# Patient Record
Sex: Male | Born: 1965
Health system: Southern US, Community
[De-identification: ages and names within clinical notes are randomized; demographics above are authoritative.]

## PROBLEM LIST (undated history)

## (undated) DIAGNOSIS — I519 Heart disease, unspecified: Secondary | ICD-10-CM

## (undated) DIAGNOSIS — G500 Trigeminal neuralgia: Secondary | ICD-10-CM

## (undated) DIAGNOSIS — G43909 Migraine, unspecified, not intractable, without status migrainosus: Secondary | ICD-10-CM

## (undated) DIAGNOSIS — I10 Essential (primary) hypertension: Secondary | ICD-10-CM

## (undated) HISTORY — DX: Trigeminal neuralgia: G50.0

## (undated) HISTORY — DX: Migraine, unspecified, not intractable, without status migrainosus: G43.909

## (undated) HISTORY — DX: Essential (primary) hypertension: I10

## (undated) HISTORY — DX: Heart disease, unspecified: I51.9

---

## 2013-09-12 DIAGNOSIS — I2510 Atherosclerotic heart disease of native coronary artery without angina pectoris: Secondary | ICD-10-CM

## 2013-09-12 HISTORY — DX: Atherosclerotic heart disease of native coronary artery without angina pectoris: I25.10

## 2019-08-31 ENCOUNTER — Encounter: Payer: Self-pay | Admitting: Cardiovascular Disease

## 2019-08-31 ENCOUNTER — Other Ambulatory Visit: Payer: Self-pay

## 2019-08-31 ENCOUNTER — Ambulatory Visit (INDEPENDENT_AMBULATORY_CARE_PROVIDER_SITE_OTHER): Payer: BC Managed Care – PPO | Admitting: Cardiovascular Disease

## 2019-08-31 VITALS — BP 144/108 | HR 96 | Ht 72.0 in | Wt 217.2 lb

## 2019-08-31 DIAGNOSIS — E7800 Pure hypercholesterolemia, unspecified: Secondary | ICD-10-CM | POA: Diagnosis not present

## 2019-08-31 DIAGNOSIS — I10 Essential (primary) hypertension: Secondary | ICD-10-CM

## 2019-08-31 DIAGNOSIS — I25110 Atherosclerotic heart disease of native coronary artery with unstable angina pectoris: Secondary | ICD-10-CM | POA: Diagnosis not present

## 2019-08-31 MED ORDER — NEBIVOLOL HCL 10 MG PO TABS: 10.0000 mg | ORAL_TABLET | Freq: Every day | ORAL | 5 refills | Status: DC

## 2019-08-31 MED ORDER — ATORVASTATIN CALCIUM 80 MG PO TABS: 80.0000 mg | ORAL_TABLET | Freq: Every day | ORAL | 5 refills | Status: DC

## 2019-08-31 NOTE — Progress Notes (Signed)
Cardiology Office Note:    Date:  08/31/2019   ID:  Kevin Browning, DOB 06/20/1966, MRN [id number]  PCP:  No primary care provider on file.  Cardiologist:  No primary care provider on file. NEW Electrophysiologist:  None   Referring MD: No ref. provider found   Chief Complaint  Patient presents with  . Coronary Artery Disease  . Chest Pain    History of Present Illness:    Kevin Browning is a 54 y.o. male with a hx of coronary artery disease presenting with exertional angina in March 2015, 95% LAD stenosis, treated with placement of a drug-eluting Promus Premier 3.5x38 stent in the proximal LAD artery [facility]).  He now presents with exertional chest discomfort occurring with varying degrees of activity.  Although he reports that he has not had chest discomfort at rest, his wife states that she has noticed him clutching his chest simply sitting in a chair.  For the most part he states that his chest discomfort occurs during work and resolves within 3 to 4 minutes of stopping to rest.  It often occurs after climbing 1 flight of stairs or even lighter activity, although the threshold is variable.  The symptoms began approximately a month ago and there seems to be a pattern of worsening.  He describes the discomfort as radiating from the lower left side of his chest to the right side associated with discomfort in his left jaw and left arm.  He motions to the center of his chest with a clawlike movement and describes the sensation as an elephant standing on his chest.  He reports that his work-up for these symptoms in 2015 was lengthy because his electrocardiogram was always normal.  He eventually underwent cardiac catheterization that showed the high-grade stenosis in the proximal LAD artery.  I was able to review the images of his angiogram on a CD.  It appears that he had a successful revascularization of the LAD artery, but it should be also believe there is an 80% stenosis in a rather large  first diagonal artery that was left for medical therapy.  There were no significant lesions in the left circumflex or dominant right coronary artery.  Left ventricular regional wall motion and overall systolic function was normal with an EF of 60% by both angiography and echo.  No significant wall motion abnormalities or valvular abnormalities were seen on his echocardiogram.  After his stent he was prescribed a number of medications, which included metoprolol, rosuvastatin and clopidogrel.  He essentially stopped all of them after about 2 months since they made him feel bad.  He does not remember the exact side effects.  He did not return for follow-up.  He continues to take aspirin 81 mg daily but is not on any other cardiac medications.  He has trigeminal neuralgia (sharp shooting pain on the right side of his jaw, currently well controlled on gabapentin and Trileptal.  He otherwise considers himself fit and healthy.  He is preparing to move into a new home [place] in about 3 weeks and is worried that his cardiac work-up will interfere with that.  His wife Kevin Browning is a Geneticist, molecular.  They are originally from [place].  Their oldest son Kevin Browning is currently in medical school.  His younger brother had multivessel coronary bypass surgery about the same time that [name] received this stent.  Past Medical History:  Diagnosis Date  . CAD (coronary artery disease) 09/2013  . HTN (hypertension)  History reviewed. No pertinent surgical history.  Current Medications: Current Meds  Medication Sig  . aspirin EC 81 MG tablet Take 81 mg by mouth daily.  Marland Kitchen gabapentin (NEURONTIN) 300 MG capsule Take 300 mg by mouth 2 (two) times daily.  . Oxcarbazepine (TRILEPTAL) 300 MG tablet Take 300 mg by mouth 2 (two) times daily.     Allergies:   Patient has no known allergies.   Social History   Socioeconomic History  . Marital status: Married    Spouse name: Not on file  . Number of  children: Not on file  . Years of education: Not on file  . Highest education level: Not on file  Occupational History  . Not on file  Tobacco Use  . Smoking status: Never Smoker  . Smokeless tobacco: Never Used  Substance and Sexual Activity  . Alcohol use: Never  . Drug use: Never  . Sexual activity: Not on file  Other Topics Concern  . Not on file  Social History Narrative  . Not on file   Social Determinants of Health   Financial Resource Strain:   . Difficulty of Paying Living Expenses: Not on file  Food Insecurity:   . Worried About Programme researcher, broadcasting/film/video in the Last Year: Not on file  . Ran Out of Food in the Last Year: Not on file  Transportation Needs:   . Lack of Transportation (Medical): Not on file  . Lack of Transportation (Non-Medical): Not on file  Physical Activity:   . Days of Exercise per Week: Not on file  . Minutes of Exercise per Session: Not on file  Stress:   . Feeling of Stress : Not on file  Social Connections:   . Frequency of Communication with Friends and Family: Not on file  . Frequency of Social Gatherings with Friends and Family: Not on file  . Attends Religious Services: Not on file  . Active Member of Clubs or Organizations: Not on file  . Attends Banker Meetings: Not on file  . Marital Status: Not on file     Family History: The patient's family history includes CAD in his brother; Cancer - Colon in his father; Diabetes in his mother; Diabetes Mellitus II in his father; Heart failure in his maternal grandmother; Hypertension in his father and mother.  ROS:   Please see the history of present illness.     All other systems reviewed and are negative.  EKGs/Labs/Other Studies Reviewed:    The following studies were reviewed today: Angiograms of diagnostic and interventional cardiac catheterization from [place], March 2015, report of echocardiogram from 2015.  EKG:  EKG is ordered today.  The ekg ordered today demonstrates  normal sinus rhythm, normal tracing  Recent Labs: No results found for requested labs within last 8760 hours.  Recent Lipid Panel No results found for: CHOL, TRIG, HDL, CHOLHDL, VLDL, LDLCALC, LDLDIRECT  Physical Exam:    VS:  BP (!) 144/108   Pulse 96   Ht 6' (1.829 m)   Wt 217 lb 3.2 oz (98.5 kg)   BMI 29.46 kg/m    Pressure rechecked 15 minutes later was 152/92 mmHg  Wt Readings from Last 3 Encounters:  08/31/19 217 lb 3.2 oz (98.5 kg)     GEN: Overweight, well nourished, well developed in no acute distress HEENT: Normal NECK: No JVD; No carotid bruits LYMPHATICS: No lymphadenopathy CARDIAC: RRR, no murmurs, rubs, gallops RESPIRATORY:  Clear to auscultation without rales, wheezing or rhonchi  ABDOMEN: Soft, non-tender, non-distended MUSCULOSKELETAL:  No edema; No deformity  SKIN: Warm and dry NEUROLOGIC:  Alert and oriented x 3 PSYCHIATRIC:  Normal affect   ASSESSMENT:    1. Coronary artery disease involving native coronary artery of native heart with unstable angina pectoris (HCC)    PLAN:    In order of problems listed above:  1. Unstable angina: Sleight is trying to minimize his symptoms, but it sounds quite convincingly that he has unstable angina pectoris.  I impressed upon him that he needs to seek emergency medical attention if his symptoms ever last for 25-30 minutes or longer.  He repeatedly tells me that the symptoms always resolve if he stops exercise.  I offered a prescription for sublingual nitroglycerin, but he declined.  He has a rather fast resting heart rate as well as elevated blood pressure and I think we will derive a lot of benefit from beta-blocker.  It appears that he tolerated metoprolol poorly in the past and this may be the reason why he stopped all of his cardiac medications.  I gave him a prescription for Bystolic 10 mg once daily and a co-pay card.  He should continue his aspirin and he will need to start a statin, once we get a fasting lipid  profile.  The EKG is reassuring, but he tells me that his EKG was also normal in the past.  We will schedule him for a Lexiscan Myoview.  If he has a reversible ischemia in the territory of the diagonal artery we might be able to simply treat this with medications.  If he has a larger area of ischemia he should undergo coronary angiography and revascularization as indicated. 2. HTN: Start Bystolic 10 mg once daily. 3. HLP: Atorvastatin 80 mg once daily, after we obtain a fasting lipid profile.   Medication Adjustments/Labs and Tests Ordered: Current medicines are reviewed at length with the patient today.  Concerns regarding medicines are outlined above.  Orders Placed This Encounter  Procedures  . Lipid panel  . MYOCARDIAL PERFUSION IMAGING  . EKG 12-Lead   Meds ordered this encounter  Medications  . atorvastatin (LIPITOR) 80 MG tablet    Sig: Take 1 tablet (80 mg total) by mouth daily.    Dispense:  30 tablet    Refill:  5  . nebivolol (BYSTOLIC) 10 MG tablet    Sig: Take 1 tablet (10 mg total) by mouth daily.    Dispense:  30 tablet    Refill:  5    Patient Instructions  Medication Instructions:  START Bystolic 10 mg once daily START Atorvastatin 80 mg once daily after you have had the fasting Lipid lab  *If you need a refill on your cardiac medications before your next appointment, please call your pharmacy*  Lab Work: Your provider would like for you to return in within a few weeks to have the following labs drawn: fasting Lipid. You do not need an appointment for the lab. Once in our office lobby there is a podium where you can sign in and ring the doorbell to alert [place] that you are here. The lab is open from 8:00 am to 4:30 pm; closed for lunch from 12:45pm-1:45pm.  If you have labs (blood work) drawn today and your tests are completely normal, you will receive your results only by: Marland Kitchen MyChart Message (if you have MyChart) OR . A paper copy in the mail If you have any lab  test that is abnormal or we need  to change your treatment, we will call you to review the results.  Testing/Procedures: Your physician has requested that you have a lexiscan myoview. For further information please visit [email or web address] Please follow instruction sheet, as given. This will take place at [address] [facility], [address]  How to prepare for your Myocardial Perfusion Test:  Do not eat or drink 3 hours prior to your test, except you may have water.  Do not consume products containing caffeine (regular or decaffeinated) 12 hours prior to your test. (ex: coffee, chocolate, sodas, tea).  Do bring a list of your current medications with you.  If not listed below, you may take your medications as normal.  Do wear comfortable clothes (no dresses or overalls) and walking shoes, tennis shoes preferred (No heels or open toe shoes are allowed).  Do NOT wear cologne, perfume, aftershave, or lotions (deodorant is allowed).  The test will take approximately 3 to 4 hours to complete  If these instructions are not followed, your test will have to be rescheduled.    Follow-Up: At [facility], you and your health needs are our priority.  As part of our continuing mission to provide you with exceptional heart care, we have created designated Provider Care Teams.  These Care Teams include your primary Cardiologist (physician) and Advanced Practice Providers (APPs -  Physician Assistants and Nurse Practitioners) who all work together to provide you with the care you need, when you need it.  Your next appointment:   Follow up with Dr. Royann Shivers for a virtual visit after the testing has been completed.    Signed, Thurmon Fair, MD  08/31/2019 5:47 PM    [facility]

## 2019-08-31 NOTE — Patient Instructions (Addendum)
Medication Instructions:  START Bystolic 10 mg once daily START Atorvastatin 80 mg once daily after you have had the fasting Lipid lab  *If you need a refill on your cardiac medications before your next appointment, please call your pharmacy*  Lab Work: Your provider would like for you to return in within a few weeks to have the following labs drawn: fasting Lipid. You do not need an appointment for the lab. Once in our office lobby there is a podium where you can sign in and ring the doorbell to alert [place] that you are here. The lab is open from 8:00 am to 4:30 pm; closed for lunch from 12:45pm-1:45pm.  If you have labs (blood work) drawn today and your tests are completely normal, you will receive your results only by: Marland Kitchen MyChart Message (if you have MyChart) OR . A paper copy in the mail If you have any lab test that is abnormal or we need to change your treatment, we will call you to review the results.  Testing/Procedures: Your physician has requested that you have a lexiscan myoview. For further information please visit [email or web address] Please follow instruction sheet, as given. This will take place at [address] [facility], [address]  How to prepare for your Myocardial Perfusion Test:  Do not eat or drink 3 hours prior to your test, except you may have water.  Do not consume products containing caffeine (regular or decaffeinated) 12 hours prior to your test. (ex: coffee, chocolate, sodas, tea).  Do bring a list of your current medications with you.  If not listed below, you may take your medications as normal.  Do wear comfortable clothes (no dresses or overalls) and walking shoes, tennis shoes preferred (No heels or open toe shoes are allowed).  Do NOT wear cologne, perfume, aftershave, or lotions (deodorant is allowed).  The test will take approximately 3 to 4 hours to complete  If these instructions are not followed, your test will have to be rescheduled.    Follow-Up: At  [facility], you and your health needs are our priority.  As part of our continuing mission to provide you with exceptional heart care, we have created designated Provider Care Teams.  These Care Teams include your primary Cardiologist (physician) and Advanced Practice Providers (APPs -  Physician Assistants and Nurse Practitioners) who all work together to provide you with the care you need, when you need it.  Your next appointment:   Follow up with Dr. Royann Shivers for a virtual visit after the testing has been completed.

## 2019-09-01 ENCOUNTER — Telehealth (HOSPITAL_COMMUNITY): Payer: Self-pay

## 2019-09-01 LAB — LIPID PANEL
Chol/HDL Ratio: 6.9 ratio — ABNORMAL HIGH (ref 0.0–5.0)
Cholesterol, Total: 159 mg/dL (ref 100–199)
HDL: 23 mg/dL — ABNORMAL LOW (ref >39)
LDL Chol Calc (NIH): 94 mg/dL (ref 0–99)
Triglycerides: 247 mg/dL — ABNORMAL HIGH (ref 0–149)
VLDL Cholesterol Cal: 42 mg/dL — ABNORMAL HIGH (ref 5–40)

## 2019-09-01 NOTE — Telephone Encounter (Signed)
Encounter complete. 

## 2019-09-02 ENCOUNTER — Telehealth: Payer: Self-pay | Admitting: *Deleted

## 2019-09-02 ENCOUNTER — Inpatient Hospital Stay (HOSPITAL_COMMUNITY): Admission: RE | Admit: 2019-09-02 | Payer: Self-pay | Source: Ambulatory Visit

## 2019-09-02 DIAGNOSIS — E785 Hyperlipidemia, unspecified: Secondary | ICD-10-CM

## 2019-09-02 NOTE — Telephone Encounter (Signed)
-----   Message from Thurmon Fair, MD sent at 09/01/2019  5:09 PM EST ----- The total cholesterol is not bad and the LDL is slightly above target (94, want it <70). The elevated triglycerides and VERY low HDL do not need meds and will get better with weight loss, avoiding excessive carbs (sweets and starches). Doing this is very important to avoid more problems in the future and to avoid developing diabetes. I very strongly recommend starting a statin to reduce the LDL. He will not need a potent or high dose statin. Since he may have had problems with Crestor in the past, I would recommend atorvastatin 10 mg daily (and recheck lipids in 3 months).

## 2019-09-02 NOTE — Telephone Encounter (Signed)
Left a message for the patient to call back.  

## 2019-09-06 MED ORDER — METOPROLOL SUCCINATE ER 25 MG PO TB24: 25.0000 mg | ORAL_TABLET | Freq: Every day | ORAL | 3 refills | Status: AC

## 2019-09-06 MED ORDER — ATORVASTATIN CALCIUM 10 MG PO TABS: 10.0000 mg | ORAL_TABLET | Freq: Every day | ORAL | 5 refills | Status: DC

## 2019-09-06 NOTE — Telephone Encounter (Signed)
Patient made aware of results and verbalized understanding.  The patient stated that he would like a replacement for Bystolic due to price. Per Dr. Royann Shivers, he may try Metoprolol Succinate 25 mg once daily.   Both medications have been sent in for the patient.

## 2019-09-06 NOTE — Telephone Encounter (Signed)
Left a message for the patient to call back.  

## 2019-09-07 ENCOUNTER — Telehealth: Payer: Self-pay | Admitting: Cardiovascular Disease

## 2019-09-08 ENCOUNTER — Telehealth (HOSPITAL_COMMUNITY): Payer: Self-pay

## 2019-09-08 NOTE — Telephone Encounter (Signed)
Encounter complete. 

## 2019-09-10 ENCOUNTER — Encounter: Payer: Self-pay | Admitting: *Deleted

## 2019-09-10 ENCOUNTER — Telehealth: Payer: Self-pay | Admitting: *Deleted

## 2019-09-10 ENCOUNTER — Ambulatory Visit (HOSPITAL_COMMUNITY)
Admission: RE | Admit: 2019-09-10 | Discharge: 2019-09-10 | Disposition: A | Discharge status: Home / Self Care | Payer: BC Managed Care – PPO | Source: Ambulatory Visit | Attending: Cardiovascular Disease | Admitting: Cardiovascular Disease

## 2019-09-10 ENCOUNTER — Other Ambulatory Visit: Payer: Self-pay

## 2019-09-10 ENCOUNTER — Other Ambulatory Visit (HOSPITAL_COMMUNITY)
Admission: RE | Admit: 2019-09-10 | Discharge: 2019-09-10 | Disposition: A | Discharge status: Home / Self Care | Payer: BC Managed Care – PPO | Source: Ambulatory Visit | Attending: Cardiovascular Disease | Admitting: Cardiovascular Disease

## 2019-09-10 DIAGNOSIS — Z20822 Contact with and (suspected) exposure to covid-19: Secondary | ICD-10-CM | POA: Diagnosis not present

## 2019-09-10 DIAGNOSIS — Z01810 Encounter for preprocedural cardiovascular examination: Secondary | ICD-10-CM | POA: Diagnosis not present

## 2019-09-10 DIAGNOSIS — Z8249 Family history of ischemic heart disease and other diseases of the circulatory system: Secondary | ICD-10-CM | POA: Diagnosis not present

## 2019-09-10 DIAGNOSIS — Z01812 Encounter for preprocedural laboratory examination: Secondary | ICD-10-CM | POA: Diagnosis not present

## 2019-09-10 DIAGNOSIS — Z01818 Encounter for other preprocedural examination: Secondary | ICD-10-CM | POA: Diagnosis not present

## 2019-09-10 DIAGNOSIS — I25110 Atherosclerotic heart disease of native coronary artery with unstable angina pectoris: Secondary | ICD-10-CM | POA: Insufficient documentation

## 2019-09-10 LAB — MYOCARDIAL PERFUSION IMAGING
LV dias vol: 132 mL (ref 62–150)
LV sys vol: 71 mL
Peak HR: 96 {beats}/min
Rest HR: 61 {beats}/min
SDS: 11
SRS: 2
SSS: 13
TID: 1.19

## 2019-09-10 MED ORDER — TECHNETIUM TC 99M TETROFOSMIN IV KIT
30.0000 | PACK | Freq: Once | INTRAVENOUS | Status: AC | PRN
  Administered 2019-09-10: 30 via INTRAVENOUS
  Filled 2019-09-10: qty 30

## 2019-09-10 MED ORDER — AMINOPHYLLINE 25 MG/ML IV SOLN
75.0000 mg | Freq: Once | INTRAVENOUS | Status: AC
  Administered 2019-09-10: 75 mg via INTRAVENOUS

## 2019-09-10 MED ORDER — REGADENOSON 0.4 MG/5ML IV SOLN
0.4000 mg | Freq: Once | INTRAVENOUS | Status: AC
  Administered 2019-09-10: 0.4 mg via INTRAVENOUS

## 2019-09-10 MED ORDER — TECHNETIUM TC 99M TETROFOSMIN IV KIT
9.7000 | PACK | Freq: Once | INTRAVENOUS | Status: AC | PRN
  Administered 2019-09-10: 9.7 via INTRAVENOUS
  Filled 2019-09-10: qty 10

## 2019-09-10 NOTE — Progress Notes (Addendum)
Valsartan reviewed the results of the myocardial perfusion study with Kevin Browning. It is a high risk study.  There is evidence of ischemia in the distribution of the mid-distal LAD artery (where he has previously had a stent) as well as ischemia in the basal-mid inferolateral wall, probably corresponding to a stenosis in the left circumflex coronary artery.  The left ventricular ejection fraction is mildly reduced.  There is no evidence of permanent scar. He continues to have exertional angina pectoris.  He reports that the symptoms occur with physical activity (he has been moving into a new home and carrying boxes), but that they promptly resolved within 5 minutes of rest.  He has been prescribed aspirin, statin and a beta-blocker. I believe he should undergo coronary angiography and if necessary and appropriate, percutaneous revascularization. The risks and benefits of diagnostic coronary angiography and possible angioplasty-stent(s) were reviewed in detail with him.  He understands and agrees to proceed.  We also reviewed the fact that he may need to indication for surgical coronary revascularization (bypass surgery) if he has multivessel involvement. Preliminary laboratory testing and Covid testing will be performed today and he is scheduled for the cardiac catheterization first case with Dr. Tom Kelly on Tuesday. 

## 2019-09-10 NOTE — H&P (View-Only) (Signed)
Valsartan reviewed the results of the myocardial perfusion study with Kevin Browning. It is a high risk study.  There is evidence of ischemia in the distribution of the mid-distal LAD artery (where he has previously had a stent) as well as ischemia in the basal-mid inferolateral wall, probably corresponding to a stenosis in the left circumflex coronary artery.  The left ventricular ejection fraction is mildly reduced.  There is no evidence of permanent scar. He continues to have exertional angina pectoris.  He reports that the symptoms occur with physical activity (he has been moving into a new home and carrying boxes), but that they promptly resolved within 5 minutes of rest.  He has been prescribed aspirin, statin and a beta-blocker. I believe he should undergo coronary angiography and if necessary and appropriate, percutaneous revascularization. The risks and benefits of diagnostic coronary angiography and possible angioplasty-stent(s) were reviewed in detail with him.  He understands and agrees to proceed.  We also reviewed the fact that he may need to indication for surgical coronary revascularization (bypass surgery) if he has multivessel involvement. Preliminary laboratory testing and Covid testing will be performed today and he is scheduled for the cardiac catheterization first case with Dr. Daphene Jaeger on Tuesday.

## 2019-09-10 NOTE — Telephone Encounter (Signed)
The patient came in for a stress test which was abnormal. Per Dr. Royann Shivers, the patient will need a left heart cath. This has been scheduled for 09/14/19 with Dr. Tresa Endo.  The patient has verbalized his understanding. Instructions have been given.  You are scheduled for a Cardiac Catheterization on Tuesday, March 2 with Dr. [name].  1. Please arrive at the [facility] (Main Entrance A) at [facility]: [address] [place], [place] [address] at 5:30 AM (This time is two hours before your procedure to ensure your preparation). Free valet parking service is available.   Special note: Every effort is made to have your procedure done on time. Please understand that emergencies sometimes delay scheduled procedures.  2. Diet: Do not eat solid foods after midnight.  The patient may have clear liquids until 5am upon the day of the procedure.  3. Labs: Your provider would like for you to have the following labs today: CBC and BMET  You will need to have the coronavirus test completed prior to your procedure. An appointment has been made at 11:50 on 09/10/19. This is a Drive Up Visit at the [facility] [address] Someone will direct you to the appropriate testing line. Please tell them that you are there for procedure testing. Stay in your car and someone will be with you shortly. Please make sure to have all other labs completed before this test because you will need to stay quarantined until your procedure.  On the morning of your procedure, take your Aspirin and any morning medicines NOT listed above.  You may use sips of water.  5. Plan for one night stay--bring personal belongings. 6. Bring a current list of your medications and current insurance cards. 7. You MUST have a responsible person to drive you home. 8. Someone MUST be with you the first 24 hours after you arrive home or your discharge will be delayed. 9. Please wear clothes that are easy to get on and off and  wear slip-on shoes.  Thank you for allowing [place] to care for you!   -- [facility]

## 2019-09-11 LAB — CBC
Hematocrit: 42.7 % (ref 37.5–51.0)
Hemoglobin: 14.5 g/dL (ref 13.0–17.7)
MCH: 29 pg (ref 26.6–33.0)
MCHC: 34 g/dL (ref 31.5–35.7)
MCV: 85 fL (ref 79–97)
Platelets: 186 10*3/uL (ref 150–450)
RBC: 5 x10E6/uL (ref 4.14–5.80)
RDW: 13.6 % (ref 11.6–15.4)
WBC: 4.2 10*3/uL (ref 3.4–10.8)

## 2019-09-11 LAB — BASIC METABOLIC PANEL
BUN/Creatinine Ratio: 13 (ref 9–20)
BUN: 15 mg/dL (ref 6–24)
CO2: 20 mmol/L (ref 20–29)
Calcium: 9.2 mg/dL (ref 8.7–10.2)
Chloride: 108 mmol/L — ABNORMAL HIGH (ref 96–106)
Creatinine, Ser: 1.2 mg/dL (ref 0.76–1.27)
GFR calc Af Amer: 79 mL/min/{1.73_m2} (ref >59)
GFR calc non Af Amer: 68 mL/min/{1.73_m2} (ref >59)
Glucose: 95 mg/dL (ref 65–99)
Potassium: 4.2 mmol/L (ref 3.5–5.2)
Sodium: 143 mmol/L (ref 134–144)

## 2019-09-11 LAB — SARS CORONAVIRUS 2 (TAT 6-24 HRS): SARS Coronavirus 2: NEGATIVE

## 2019-09-13 ENCOUNTER — Telehealth: Payer: Self-pay | Admitting: *Deleted

## 2019-09-13 NOTE — Telephone Encounter (Signed)
Pt contacted pre-catheterization scheduled at [facility] for: Tuesday September 14, 2019 7:30 AM Verified arrival time and place: [facility] Main Entrance A [facility]) at: 5:30 AM   No solid food after midnight prior to cath, clear liquids until 5 AM day of procedure. Contrast allergy: no   AM meds can be  taken pre-cath with sip of water including: ASA 81 mg   Confirmed patient has responsible adult to drive home post procedure and observe 24 hours after arriving home: yes  Currently, due to Covid-19 pandemic, only one person will be allowed with patient. Must be the same person for patient's entire stay and will be required to wear a mask. They will be asked to wait in the waiting room for the duration of the patient's stay.  Patients are required to wear a mask when they enter the hospital.     COVID-19 Pre-Screening Questions:  . In the past 7 to 10 days have you had a cough,  shortness of breath, headache, congestion, fever (100 or greater) body aches, chills, sore throat, or sudden loss of taste or sense of smell? Shortness of breath, not new in 7-10 days . Have you been around anyone with known Covid 19 in the past 7-10 days?  no . Have you been around anyone who is awaiting Covid 19 test results in the past 7 to 10 days? no . Have you been around anyone who has been exposed to Covid 19, or has mentioned symptoms of Covid 19 within the past 7 to 10 days? No  I reviewed procedure/maskk/visitor instructions , COVID-19 screening questions with patient, he verbalized understanding, thanked me for call.

## 2019-09-14 ENCOUNTER — Ambulatory Visit (HOSPITAL_COMMUNITY)
Admission: RE | Admit: 2019-09-14 | Discharge: 2019-09-14 | Disposition: A | Discharge status: Home / Self Care | Payer: BC Managed Care – PPO | Attending: Cardiovascular Disease | Admitting: Cardiovascular Disease

## 2019-09-14 ENCOUNTER — Encounter (HOSPITAL_COMMUNITY)
Admission: RE | Disposition: A | Discharge status: Home / Self Care | Payer: BC Managed Care – PPO | Source: Home / Self Care | Attending: Cardiovascular Disease

## 2019-09-14 ENCOUNTER — Other Ambulatory Visit: Payer: Self-pay

## 2019-09-14 DIAGNOSIS — R9439 Abnormal result of other cardiovascular function study: Secondary | ICD-10-CM | POA: Diagnosis not present

## 2019-09-14 DIAGNOSIS — I10 Essential (primary) hypertension: Secondary | ICD-10-CM | POA: Insufficient documentation

## 2019-09-14 DIAGNOSIS — Z7982 Long term (current) use of aspirin: Secondary | ICD-10-CM | POA: Diagnosis not present

## 2019-09-14 DIAGNOSIS — I2582 Chronic total occlusion of coronary artery: Secondary | ICD-10-CM | POA: Insufficient documentation

## 2019-09-14 DIAGNOSIS — Z79899 Other long term (current) drug therapy: Secondary | ICD-10-CM | POA: Insufficient documentation

## 2019-09-14 DIAGNOSIS — E7800 Pure hypercholesterolemia, unspecified: Secondary | ICD-10-CM | POA: Diagnosis not present

## 2019-09-14 DIAGNOSIS — Z95820 Peripheral vascular angioplasty status with implants and grafts: Secondary | ICD-10-CM

## 2019-09-14 DIAGNOSIS — Z8249 Family history of ischemic heart disease and other diseases of the circulatory system: Secondary | ICD-10-CM | POA: Diagnosis not present

## 2019-09-14 DIAGNOSIS — I25119 Atherosclerotic heart disease of native coronary artery with unspecified angina pectoris: Secondary | ICD-10-CM

## 2019-09-14 DIAGNOSIS — I209 Angina pectoris, unspecified: Secondary | ICD-10-CM

## 2019-09-14 HISTORY — PX: CORONARY STENT INTERVENTION: CATH118234

## 2019-09-14 HISTORY — PX: LEFT HEART CATH AND CORONARY ANGIOGRAPHY: CATH118249

## 2019-09-14 LAB — POCT ACTIVATED CLOTTING TIME
Activated Clotting Time: 263 seconds
Activated Clotting Time: 389 seconds

## 2019-09-14 SURGERY — LEFT HEART CATH AND CORONARY ANGIOGRAPHY
Anesthesia: LOCAL

## 2019-09-14 MED ORDER — SODIUM CHLORIDE 0.9 % IV SOLN: 250.0000 mL | INTRAVENOUS | Status: DC | PRN

## 2019-09-14 MED ORDER — SODIUM CHLORIDE 0.9 % WEIGHT BASED INFUSION: 1.0000 mL/kg/h | INTRAVENOUS | Status: DC

## 2019-09-14 MED ORDER — VERAPAMIL HCL 2.5 MG/ML IV SOLN
INTRAVENOUS | Status: AC
  Filled 2019-09-14: qty 2

## 2019-09-14 MED ORDER — LIDOCAINE HCL (PF) 1 % IJ SOLN
INTRAMUSCULAR | Status: AC
  Filled 2019-09-14: qty 30

## 2019-09-14 MED ORDER — GABAPENTIN 600 MG PO TABS: 600.0000 mg | ORAL_TABLET | Freq: Two times a day (BID) | ORAL | Status: DC

## 2019-09-14 MED ORDER — MIDAZOLAM HCL 2 MG/2ML IJ SOLN
INTRAMUSCULAR | Status: AC
  Filled 2019-09-14: qty 2

## 2019-09-14 MED ORDER — LIDOCAINE HCL (PF) 1 % IJ SOLN
INTRAMUSCULAR | Status: DC | PRN
  Administered 2019-09-14: 2 mL

## 2019-09-14 MED ORDER — VITAMIN D3 25 MCG PO TABS: 1000.0000 [IU] | ORAL_TABLET | ORAL | Status: DC

## 2019-09-14 MED ORDER — SODIUM CHLORIDE 0.9 % IV SOLN
INTRAVENOUS | Status: AC | PRN
  Administered 2019-09-14: 125 mL/h via INTRAVENOUS

## 2019-09-14 MED ORDER — TICAGRELOR 90 MG PO TABS: 90.0000 mg | ORAL_TABLET | Freq: Two times a day (BID) | ORAL | 2 refills | Status: DC

## 2019-09-14 MED ORDER — NITROGLYCERIN 1 MG/10 ML FOR IR/CATH LAB
INTRA_ARTERIAL | Status: AC
  Filled 2019-09-14: qty 10

## 2019-09-14 MED ORDER — ASCORBIC ACID 500 MG PO TABS: 500.0000 mg | ORAL_TABLET | ORAL | Status: DC

## 2019-09-14 MED ORDER — SODIUM CHLORIDE 0.9% FLUSH: 3.0000 mL | INTRAVENOUS | Status: DC | PRN

## 2019-09-14 MED ORDER — ACETAMINOPHEN 325 MG PO TABS: 650.0000 mg | ORAL_TABLET | ORAL | Status: DC | PRN

## 2019-09-14 MED ORDER — FENTANYL CITRATE (PF) 100 MCG/2ML IJ SOLN
INTRAMUSCULAR | Status: AC
  Filled 2019-09-14: qty 2

## 2019-09-14 MED ORDER — DIAZEPAM 5 MG PO TABS: 5.0000 mg | ORAL_TABLET | Freq: Four times a day (QID) | ORAL | Status: DC | PRN

## 2019-09-14 MED ORDER — ASPIRIN 81 MG PO CHEW
81.0000 mg | CHEWABLE_TABLET | Freq: Every day | ORAL | Status: DC
  Administered 2019-09-14: 81 mg via ORAL

## 2019-09-14 MED ORDER — SODIUM CHLORIDE 0.9% FLUSH: 3.0000 mL | Freq: Two times a day (BID) | INTRAVENOUS | Status: DC

## 2019-09-14 MED ORDER — ASPIRIN EC 81 MG PO TBEC: 81.0000 mg | DELAYED_RELEASE_TABLET | Freq: Every day | ORAL | Status: DC

## 2019-09-14 MED ORDER — FENTANYL CITRATE (PF) 100 MCG/2ML IJ SOLN
INTRAMUSCULAR | Status: DC | PRN
  Administered 2019-09-14: 50 ug via INTRAVENOUS

## 2019-09-14 MED ORDER — OXCARBAZEPINE 300 MG PO TABS: 600.0000 mg | ORAL_TABLET | Freq: Two times a day (BID) | ORAL | Status: DC

## 2019-09-14 MED ORDER — LABETALOL HCL 5 MG/ML IV SOLN: 10.0000 mg | INTRAVENOUS | Status: DC | PRN

## 2019-09-14 MED ORDER — ATORVASTATIN CALCIUM 80 MG PO TABS: 80.0000 mg | ORAL_TABLET | Freq: Every day | ORAL | Status: DC

## 2019-09-14 MED ORDER — METOPROLOL SUCCINATE ER 25 MG PO TB24: 25.0000 mg | ORAL_TABLET | Freq: Every day | ORAL | Status: DC

## 2019-09-14 MED ORDER — HEPARIN SODIUM (PORCINE) 1000 UNIT/ML IJ SOLN
INTRAMUSCULAR | Status: AC
  Filled 2019-09-14: qty 1

## 2019-09-14 MED ORDER — MIDAZOLAM HCL 2 MG/2ML IJ SOLN
INTRAMUSCULAR | Status: DC | PRN
  Administered 2019-09-14: 2 mg via INTRAVENOUS

## 2019-09-14 MED ORDER — HEPARIN (PORCINE) IN NACL 1000-0.9 UT/500ML-% IV SOLN
INTRAVENOUS | Status: DC | PRN
  Administered 2019-09-14: 500 mL

## 2019-09-14 MED ORDER — TICAGRELOR 90 MG PO TABS
ORAL_TABLET | ORAL | Status: AC
  Filled 2019-09-14: qty 2

## 2019-09-14 MED ORDER — TICAGRELOR 90 MG PO TABS
ORAL_TABLET | ORAL | Status: DC | PRN
  Administered 2019-09-14: 180 mg via ORAL

## 2019-09-14 MED ORDER — SODIUM CHLORIDE 0.9 % WEIGHT BASED INFUSION
3.0000 mL/kg/h | INTRAVENOUS | Status: AC
  Administered 2019-09-14: 3 mL/kg/h via INTRAVENOUS

## 2019-09-14 MED ORDER — SODIUM CHLORIDE 0.9 % IV SOLN: INTRAVENOUS | Status: DC

## 2019-09-14 MED ORDER — HEPARIN SODIUM (PORCINE) 1000 UNIT/ML IJ SOLN
INTRAMUSCULAR | Status: DC | PRN
  Administered 2019-09-14 (×2): 5000 [IU] via INTRAVENOUS
  Administered 2019-09-14: 2000 [IU] via INTRAVENOUS

## 2019-09-14 MED ORDER — IOHEXOL 350 MG/ML SOLN
INTRAVENOUS | Status: DC | PRN
  Administered 2019-09-14: 160 mL

## 2019-09-14 MED ORDER — VERAPAMIL HCL 2.5 MG/ML IV SOLN
INTRAVENOUS | Status: DC | PRN
  Administered 2019-09-14: 10 mL via INTRA_ARTERIAL

## 2019-09-14 MED ORDER — ATORVASTATIN CALCIUM 80 MG PO TABS: 80.0000 mg | ORAL_TABLET | Freq: Every day | ORAL | 1 refills | Status: DC

## 2019-09-14 MED ORDER — NITROGLYCERIN 1 MG/10 ML FOR IR/CATH LAB
INTRA_ARTERIAL | Status: DC | PRN
  Administered 2019-09-14 (×3): 200 ug via INTRACORONARY

## 2019-09-14 MED ORDER — TICAGRELOR 90 MG PO TABS: 90.0000 mg | ORAL_TABLET | Freq: Two times a day (BID) | ORAL | Status: DC

## 2019-09-14 MED ORDER — HYDRALAZINE HCL 20 MG/ML IJ SOLN: 10.0000 mg | INTRAMUSCULAR | Status: DC | PRN

## 2019-09-14 MED ORDER — ONDANSETRON HCL 4 MG/2ML IJ SOLN: 4.0000 mg | Freq: Four times a day (QID) | INTRAMUSCULAR | Status: DC | PRN

## 2019-09-14 MED ORDER — HEPARIN (PORCINE) IN NACL 1000-0.9 UT/500ML-% IV SOLN
INTRAVENOUS | Status: AC
  Filled 2019-09-14: qty 1000

## 2019-09-14 MED ORDER — ASPIRIN 81 MG PO CHEW
81.0000 mg | CHEWABLE_TABLET | ORAL | Status: DC
  Filled 2019-09-14: qty 1

## 2019-09-14 MED FILL — BRILINTA 90 MG TABLET: 90 | 30 days supply | Qty: 60 | Fill #0

## 2019-09-14 MED FILL — ATORVASTATIN CALCIUM 80 MG: 80 | 90 days supply | Qty: 90 | Fill #0

## 2019-09-14 SURGICAL SUPPLY — 19 items
BALLN WOLVERINE 2.00X6 (BALLOONS) ×2
BALLN ~~LOC~~ EMERGE MR 2.5X6 (BALLOONS) ×2
BALLOON WOLVERINE 2.00X6 (BALLOONS) IMPLANT
BALLOON ~~LOC~~ EMERGE MR 2.5X6 (BALLOONS) IMPLANT
CATH INFINITI JR4 5F (CATHETERS) ×1 IMPLANT
CATH OPTITORQUE TIG 4.0 5F (CATHETERS) ×1 IMPLANT
CATH VISTA GUIDE 6FR XB3.5 (CATHETERS) ×1 IMPLANT
DEVICE RAD COMP TR BAND LRG (VASCULAR PRODUCTS) ×1 IMPLANT
GLIDESHEATH SLEND SS 6F .021 (SHEATH) ×1 IMPLANT
GUIDEWIRE INQWIRE 1.5J.035X260 (WIRE) IMPLANT
INQWIRE 1.5J .035X260CM (WIRE) ×2
KIT ENCORE 26 ADVANTAGE (KITS) ×1 IMPLANT
KIT HEART LEFT (KITS) ×2 IMPLANT
PACK CARDIAC CATHETERIZATION (CUSTOM PROCEDURE TRAY) ×2 IMPLANT
STENT RESOLUTE ONYX 2.5X8 (Permanent Stent) ×1 IMPLANT
TRANSDUCER W/STOPCOCK (MISCELLANEOUS) ×2 IMPLANT
TUBING CIL FLEX 10 FLL-RA (TUBING) ×2 IMPLANT
WIRE COUGAR XT STRL 190CM (WIRE) ×1 IMPLANT
WIRE COUGAR XT STRL 300CM (WIRE) ×1 IMPLANT

## 2019-09-14 NOTE — Discharge Instructions (Signed)
Carotid Angioplasty With Stent, Care After This sheet gives you information about how to care for yourself after your procedure. Your doctor may also give you more specific instructions. If you have problems or questions, contact your doctor. What can I expect after the procedure? After the procedure, it is common to have:  Bruising at the site where the tube (catheter) was inserted. This usually goes away within 1-2 weeks.  A small amount of blood or clear fluid coming from your cut from surgery (incision).  A small amount of blood collecting under your skin (hematoma) around the tube site. This may form a lump that can be sore and tender. This usually lasts for 1-2 weeks. Follow these instructions at home: Medicines  Take over-the-counter and prescription medicines only as told by your doctor.  Your doctor may prescribe a medicine to thin your blood after the procedure. This will help to prevent blood clots.  If you were prescribed an antibiotic medicine, take it as told by your doctor. Do not stop taking the antibiotic even if you start to feel better.  Ask your doctor if the medicine prescribed to you requires you to avoid driving or using heavy machinery. Incision care   Keep your cut from surgery clean and dry.  Follow instructions from your doctor about how to take care of your cut from surgery. Make sure you: ? Wash your hands with soap and water before and after you change your bandage (dressing). If you cannot use soap and water, use hand sanitizer. ? Change your bandage as told by your doctor. ? Do not rub the site. This may cause bleeding. ? Leave stitches (sutures), skin glue, or skin tape (adhesive) strips in place. They may need to stay in place for 2 weeks or longer. If tape strips get loose and curl up, you may trim the loose edges. Do not remove tape strips completely unless your doctor says it is okay.  Check the area around your cut every day for signs of infection.  Check for: ? More redness, swelling, or pain. ? More fluid or blood. ? Warmth. ? Pus or a bad smell. Activity  Return to your normal activities as told by your doctor. Ask your doctor what activities are safe for you.  Do not lift anything that is heavier than 10 lb (4.5 kg), or the limit that you are told, until your doctor says that it is safe.  Avoid sex until your doctor says that this is safe for you. Eating and drinking   Follow instructions from your doctor about what you cannot eat or drink.  Drink enough fluid to keep your pee (urine) pale yellow.  Eat a heart-healthy diet. This includes foods like fresh fruits and vegetables, whole grains, low-fat dairy products, and low-fat (lean) meats. Avoid foods that are: ? High in salt, saturated fat, or sugar. ? Canned or highly processed. ? Fried. Lifestyle  If you drink alcohol: ? Limit how much you use to:  0-1 drink a day for women.  0-2 drinks a day for men. ? Be aware of how much alcohol is in your drink. In the [place] one drink equals one 12 oz bottle of beer (355 mL), one 5 oz glass of wine (148 mL), or one 1 oz glass of hard liquor (44 mL).  Do not use any products that contain nicotine or tobacco, such as cigarettes, e-cigarettes, and chewing tobacco. If you need help quitting, ask your doctor.  Work with your doctor   to keep your blood pressure under control.  Stay at a healthy weight. General instructions  Do not take baths, swim, or use a hot tub until your doctor approves.  Avoid straining to poop to keep the cut from bleeding.  Tell all doctors who provide care for you that you have a stent.  Keep all follow-up visits as told by your doctor. This is important. Contact a doctor if:  You have more redness, swelling, or pain around your cut.  You have more fluid or blood coming from your cut.  The area around your cut feels warm to the touch.  You have pus or a bad smell coming from your cut.  You  have a lump caused by bleeding under your skin, and the lump does not go away after 2 weeks.  You have a fever. Get help right away if:  You have a hard time breathing.  You have pain in your chest.  You have any signs of a stroke. You may be at risk for a stroke even after this procedure. You may be at greater risk of a stroke if you have diabetes, lung disease, kidney disease, had a previous stroke, or are 80 years of age or older. "BE FAST" is an easy way to remember the main warning signs: ? B - Balance. Signs are dizziness, sudden trouble walking, or loss of balance. ? E - Eyes. Signs are trouble seeing or a change in how you see. ? F - Face. Signs are sudden weakness or loss of feeling of the face, or the face or eyelid drooping on one side. ? A - Arms. Signs are weakness or loss of feeling in an arm. This happens suddenly and usually on one side of the body. ? S - Speech. Signs are sudden trouble speaking, slurred speech, or trouble understanding what people say. ? T - Time. Time to call emergency services. Write down what time symptoms started.  You have other signs of a stroke, such as: ? A sudden, very bad headache with no known cause. ? Feeling sick to your stomach (nausea). ? Vomiting. ? Jerky movements you cannot control (seizure).  You notice a lump caused by bleeding under your skin and the lump is quickly getting larger.  You suddenly get pain in the area where your stent was placed.  Your cut from surgery starts to bleed and does not stop after you hold pressure on it for a few minutes. These symptoms may be an emergency. Do not wait to see if the symptoms will go away. Get medical help right away. Call your local emergency services (911 in the [place] Do not drive yourself to the hospital. Summary  After the procedure, it is common to have bruising or a small amount of blood or fluid in the area where the surgery cut was made.  Take over-the-counter and prescription  medicines only as told by your doctor.  Return to your normal activities as told by your doctor. Ask your doctor what activities are safe for you.  Make sure you know which symptoms to watch for and when to get medical help right away. This information is not intended to replace advice given to you by your health care provider. Make sure you discuss any questions you have with your health care provider. Document Revised: 08/10/2018 Document Reviewed: 05/14/2018 Elsevier Patient Education  2020 [employer]  

## 2019-09-14 NOTE — Discharge Summary (Signed)
Discharge Summary for Same Day PCI   Patient ID: Kevin Browning MRN: [id number]; DOB: 12-11-1965  Admit date: 09/14/2019 Discharge date: 09/15/2019  Primary Care Provider: Patient, No Pcp Per  Primary Cardiologist: Kevin Fair, MD  Primary Electrophysiologist:  None   Discharge Diagnoses    Active Problems:   Angina pectoris [name])   Abnormal nuclear stress test    Diagnostic Studies/Procedures    Cardiac Catheterization 09/15/2019:   1st Diag lesion is 100% stenosed.  2nd Mrg lesion is 30% stenosed.  Prox Cx lesion is 95% stenosed.  Post intervention, there is a 0% residual stenosis.  Ost LAD to Mid LAD lesion is 5% stenosed.  Prox RCA lesion is 30% stenosed.  Dist RCA lesion is 10% stenosed.  Mid Cx lesion is 20% stenosed.  A stent was successfully placed.   Widely patent stent in the proximal to mid LAD with total occlusion of a diagonal vessel arising from the stented segment which is well collateralized from the distal LAD proximally.  The left circumflex vessel is a moderate size vessel with a 95% stenosis just immediately proximal to bifurcation into an OM vessel and AV groove circumflex.  There is smooth 30% narrowing near the ostium of the OM with 20% mid AV groove circumflex stenosis.  Mild nonobstructive disease in a dominant RCA with luminal irregularity, and 30% mid stenosis with 10% distal stenosis.  Successful percutaneous coronary intervention to the 95% circumflex stenosis treated with initial Wolverine cutting Balloon predilatation and DES stenting with a 2.5 x 8 mm Resolute DES stent postdilated to 2.58 mm with the 95% stenosis being reduced to 0%.  Preserved global LV contractility with EF estimated 55% and evidence for small area of hypocontractility involving the mid anterolateral wall.  LVEDP 20 to 23 mm Hg.  RECOMMENDATION: DAPT aspirin/Brilinta for a minimum of 6 months or possibly longer.  Medical therapy for concomitant CAD.   Aggressive lipid-lowering therapy with target LDL less than 70.   Diagnostic Dominance: Right  Intervention    _____________   History of Present Illness     Kevin Browning is a 54 y.o. male with with a hx of coronary artery disease presenting with exertional angina in March 2015, 95% LAD stenosis, treated with placement of a drug-eluting Promus Premier 3.5x38 stent in the proximal LAD artery [facility]).  He presented to the office with exertional chest discomfort occurring with varying degrees of activity.  Although he reported that he has not had chest discomfort at rest, his wife stated that she had noticed him clutching his chest simply sitting in a chair.  For the most part he stated that his chest discomfort occurs during work and resolves within 3 to 4 minutes of stopping to rest.  It often occurred after climbing 1 flight of stairs or even lighter activity, although the threshold is variable.  The symptoms began approximately a month ago and there seemed to be a pattern of worsening.  He describes the discomfort as radiating from the lower left side of his chest to the right side associated with discomfort in his left jaw and left arm.   He reported that his work-up for these symptoms in 2015 was lengthy because his electrocardiogram was always normal.  He eventually underwent cardiac catheterization that showed the high-grade stenosis in the proximal LAD artery.  I was able to review the images of his angiogram on a CD.  It appearred that he had a successful revascularization of the LAD artery, but there  was also an 80% stenosis in a rather large first diagonal artery that was left for medical therapy.  There were no significant lesions in the left circumflex or dominant right coronary artery.  Left ventricular regional wall motion and overall systolic function was normal with an EF of 60% by both angiography and echo.  No significant wall motion abnormalities or valvular abnormalities were  seen on his echocardiogram.  After his stent he was prescribed a number of medications, which included metoprolol, rosuvastatin and clopidogrel.  He essentially stopped all of them after about 2 months since they made him feel bad.  He did not remember the exact side effects.  He did not return for follow-up.  He continued to take aspirin 81 mg daily but was not on any other cardiac medications.  He has trigeminal neuralgia (sharp shooting pain on the right side of his jaw, currently well controlled on gabapentin and Trileptal.  He otherwise considers himself fit and healthy.  He is preparing to move into a new home in about 3 weeks and was worried that his cardiac work-up will interfere with that.  His wife Kevin Browning is a Scientist, research (physical sciences).  They are originally from [place].  His younger brother had multivessel coronary bypass surgery about the same time that [name] received this stent.   Outpatient cardiac catheterization was arranged for further evaluation.  Hospital Course     The patient underwent cardiac cath as noted above with PCI/DES x1 to Lcx. Noted occlusion of diag vessel which was collateralized from the dLAD. Plan for DAPT with ASA/Brilinta for at least 6 months. The patient was seen by cardiac rehab while in short stay. There were no observed complications post cath. Radial cath site was re-evaluated prior to discharge and found to be stable without any complications.   Kevin Browning was seen by Dr. Tresa Browning and determined stable for discharge home. Follow up with our office has been arranged. Instructions/precautions regarding cath site care were given prior to discharge. Medications are listed below. Pertinent changes include addition of atorvastatin 80mg  daily.  _____________  Did the patient have an acute coronary syndrome (MI, NSTEMI, STEMI, etc) this admission?:  No                               Did the patient have a percutaneous coronary intervention (stent /  angioplasty)?:  Yes.     Cath/PCI Registry Performance & Quality Measures: 1. Aspirin prescribed? - Yes 2. ADP Receptor Inhibitor (Plavix/Clopidogrel, Brilinta/Ticagrelor or Effient/Prasugrel) prescribed (includes medically managed patients)? - Yes 3. High Intensity Statin (Lipitor 40-80mg  or Crestor 20-40mg ) prescribed? - Yes 4. For EF <40%, was ACEI/ARB prescribed? - Not Applicable (EF >/= 40%) 5. For EF <40%, Aldosterone Antagonist (Spironolactone or Eplerenone) prescribed? - Not Applicable (EF >/= 40%) 6. Cardiac Rehab Phase II ordered (Included Medically managed Patients)? - Yes   _____________   Discharge Vitals Blood pressure (!) 157/78, pulse 60, temperature 97.7 F (36.5 C), temperature source Oral, resp. rate 14, height 6' (1.829 m), weight 95.3 kg, SpO2 99 %.  Filed Weights   09/14/19 0559  Weight: 95.3 kg    Last Labs & Radiologic Studies    CBC No results for input(s): WBC, NEUTROABS, HGB, HCT, MCV, PLT in the last 72 hours. Basic Metabolic Panel No results for input(s): NA, K, CL, CO2, GLUCOSE, BUN, CREATININE, CALCIUM, MG, PHOS in the last 72 hours. Liver Function Tests No results  for input(s): AST, ALT, ALKPHOS, BILITOT, PROT, ALBUMIN in the last 72 hours. No results for input(s): LIPASE, AMYLASE in the last 72 hours. High Sensitivity Troponin:   No results for input(s): TROPONINIHS in the last 720 hours.  BNP Invalid input(s): POCBNP D-Dimer No results for input(s): DDIMER in the last 72 hours. Hemoglobin A1C No results for input(s): HGBA1C in the last 72 hours. Fasting Lipid Panel No results for input(s): CHOL, HDL, LDLCALC, TRIG, CHOLHDL, LDLDIRECT in the last 72 hours. Thyroid Function Tests No results for input(s): TSH, T4TOTAL, T3FREE, THYROIDAB in the last 72 hours.  Invalid input(s): FREET3 _____________  CARDIAC CATHETERIZATION  Result Date: 09/14/2019  1st Diag lesion is 100% stenosed.  2nd Mrg lesion is 30% stenosed.  Prox Cx lesion is 95%  stenosed.  Post intervention, there is a 0% residual stenosis.  Ost LAD to Mid LAD lesion is 5% stenosed.  Prox RCA lesion is 30% stenosed.  Dist RCA lesion is 10% stenosed.  Mid Cx lesion is 20% stenosed.  A stent was successfully placed.  Widely patent stent in the proximal to mid LAD with total occlusion of a diagonal vessel arising from the stented segment which is well collateralized from the distal LAD proximally. The left circumflex vessel is a moderate size vessel with a 95% stenosis just immediately proximal to bifurcation into an OM vessel and AV groove circumflex.  There is smooth 30% narrowing near the ostium of the OM with 20% mid AV groove circumflex stenosis. Mild nonobstructive disease in a dominant RCA with luminal irregularity, and 30% mid stenosis with 10% distal stenosis. Successful percutaneous coronary intervention to the 95% circumflex stenosis treated with initial Wolverine cutting Balloon predilatation and DES stenting with a 2.5 x 8 mm Resolute DES stent postdilated to 2.58 mm with the 95% stenosis being reduced to 0%. Preserved global LV contractility with EF estimated 55% and evidence for small area of hypocontractility involving the mid anterolateral wall.  LVEDP 20 to 23 mm Hg. RECOMMENDATION: DAPT aspirin/Brilinta for a minimum of 6 months or possibly longer.  Medical therapy for concomitant CAD.  Aggressive lipid-lowering therapy with target LDL less than 70.    MYOCARDIAL PERFUSION IMAGING  Result Date: 09/10/2019  The left ventricular ejection fraction is mildly decreased (45-54%).  Nuclear stress EF: 46%.  There was no ST segment deviation noted during stress.  Defect 1: There is a large defect of severe severity present in the basal anterolateral, mid anterolateral, apical anterior and apical lateral location.  Findings consistent with ischemia.  This is a high risk study.  Abnormal, high risk stress nuclear study with severe ischemia involving the anterolateral  wall.  Gated ejection fraction was 46% with hypokinesis of the anterior lateral wall.  Mild left ventricular enlargement.  Patient seen by Dr. Royann Shivers in clinic and cardiac catheterization arranged.    Disposition   Pt is being discharged home today in good condition.  Follow-up Plans & Appointments   Follow-up Information    Croitoru, Mihai, MD Follow up on 09/22/2019.   Specialty: Cardiology Why: at 8am for your follow up appt. This will be a virtual visit.  Contact information: [address] [place] [address] [phone]          Discharge Instructions    Amb Referral to Cardiac Rehabilitation   Complete by: As directed    Diagnosis: Coronary Stents   After initial evaluation and assessments completed: Virtual Based Care may be provided alone or in conjunction with  Phase 2 Cardiac Rehab based on patient barriers.: Yes     Discharge Medications   Allergies as of 09/14/2019   No Known Allergies     Medication List    TAKE these medications   ascorbic acid 500 MG tablet Commonly known as: VITAMIN C Take 500 mg by mouth 3 (three) times a week.   aspirin EC 81 MG tablet Take 81 mg by mouth daily.   atorvastatin 80 MG tablet Commonly known as: Lipitor Take 1 tablet (80 mg total) by mouth daily.   cholecalciferol 25 MCG (1000 UNIT) tablet Commonly known as: VITAMIN D3 Take 1,000 Units by mouth 3 (three) times a week.   gabapentin 600 MG tablet Commonly known as: NEURONTIN Take 600 mg by mouth 2 (two) times daily.   metoprolol succinate 25 MG 24 hr tablet Commonly known as: TOPROL-XL Take 1 tablet (25 mg total) by mouth daily. Take with or immediately following a meal. What changed:   when to take this  additional instructions   oxcarbazepine 600 MG tablet Commonly known as: TRILEPTAL Take 600 mg by mouth 2 (two) times daily.   ticagrelor 90 MG Tabs tablet Commonly known as: Brilinta Take 1 tablet (90 mg total) by mouth 2 (two)  times daily.        Allergies No Known Allergies  Outstanding Labs/Studies   FLP/LFTs in 8 weeks  Duration of Discharge Encounter   Greater than 30 minutes including physician time.  Signed, Reino Bellis, NP 09/15/2019, 12:21 PM

## 2019-09-14 NOTE — Interval H&P Note (Signed)
Cath Lab Visit (complete for each Cath Lab visit)  Clinical Evaluation Leading to the Procedure:   ACS: No.  Non-ACS:    Anginal Classification: CCS III  Anti-ischemic medical therapy: No Therapy  Non-Invasive Test Results: High-risk stress test findings: cardiac mortality >3%/year  Prior CABG: No previous CABG      History and Physical Interval Note:  09/14/2019 7:34 AM  Kevin Browning  has presented today for surgery, with the diagnosis of Abnormal stress test.  The various methods of treatment have been discussed with the patient and family. After consideration of risks, benefits and other options for treatment, the patient has consented to  Procedure(s): LEFT HEART CATH AND CORONARY ANGIOGRAPHY (N/A) as a surgical intervention.  The patient's history has been reviewed, patient examined, no change in status, stable for surgery.  I have reviewed the patient's chart and labs.  Questions were answered to the patient's satisfaction.     [name]

## 2019-09-14 NOTE — Progress Notes (Signed)
[id number] Discussed with pt the importance of brilinta with stent. Discussed NTG use--discussed with pt that if he does not have NTG and has persistent CP to call 911. Gave heart healthy diet, walking instructions for ex and discussed CRP 2. Referral made to [place]. Pt not interested in APP.  Understanding voice of ed.  Luetta Nutting RN BSN 09/14/2019 11:37 AM

## 2019-09-16 ENCOUNTER — Telehealth (HOSPITAL_COMMUNITY): Payer: Self-pay

## 2019-09-16 ENCOUNTER — Telehealth: Payer: Self-pay | Admitting: Cardiovascular Disease

## 2019-09-16 NOTE — Telephone Encounter (Signed)
Pt insurance is active and benefits verified through [employer] Co-pay 0, DED $5,000/$700.30 met, out of pocket $6,550/$708.97 met, co-insurance 20%. no pre-authorization required. Passport, 09/16/2019@2 :59pm, REF# [id number]  Will contact patient to see if he is interested in the Cardiac Rehab Program. If interested, patient will need to complete follow up appt. Once completed, patient will be contacted for scheduling upon review by the RN Navigator.

## 2019-09-22 ENCOUNTER — Telehealth: Payer: Self-pay | Admitting: *Deleted

## 2019-09-22 ENCOUNTER — Telehealth (INDEPENDENT_AMBULATORY_CARE_PROVIDER_SITE_OTHER): Payer: BC Managed Care – PPO | Admitting: Cardiovascular Disease

## 2019-09-22 ENCOUNTER — Encounter: Payer: Self-pay | Admitting: Cardiovascular Disease

## 2019-09-22 DIAGNOSIS — D750 Familial erythrocytosis: Secondary | ICD-10-CM | POA: Diagnosis not present

## 2019-09-22 DIAGNOSIS — Z8739 Personal history of other diseases of the musculoskeletal system and connective tissue: Secondary | ICD-10-CM

## 2019-09-22 DIAGNOSIS — I25118 Atherosclerotic heart disease of native coronary artery with other forms of angina pectoris: Secondary | ICD-10-CM

## 2019-09-22 DIAGNOSIS — E785 Hyperlipidemia, unspecified: Secondary | ICD-10-CM

## 2019-09-22 MED ORDER — COENZYME Q10 300 MG PO CAPS: 300.0000 mg | ORAL_CAPSULE | Freq: Every day | ORAL | 0 refills | Status: DC

## 2019-09-22 MED ORDER — ATORVASTATIN CALCIUM 40 MG PO TABS: 40.0000 mg | ORAL_TABLET | Freq: Every day | ORAL | 0 refills | Status: DC

## 2019-09-22 NOTE — Progress Notes (Signed)
Cardiology Office Note:    Date:  09/22/2019   ID:  Kevin Browning, DOB Jul 24, 1965, MRN [id number]  PCP:  Patient, No Pcp Per  Cardiologist:  Thurmon Fair, MD NEW Electrophysiologist:  None   Referring MD: No ref. provider found   No chief complaint on file.   History of Present Illness:    Kevin Browning is a 54 y.o. male with a hx of low HDL dyslipidemia and coronary artery disease, initially presenting with exertional angina in March 2015 ( 95% LAD stenosis, treated with placement of a drug-eluting Promus Premier 3.5x38 stent in the proximal LAD artery in [place]), returning with exertional angina in February 2021.  Nuclear stress testing showed 2 areas of ischemia in the inferolateral and anterolateral walls, corresponding to the left circumflex and diagonal arteries respectively.  Cardiac catheterization on 09/14/2019 showed total occlusion of a large diagonal artery filling via collaterals and a 95% stenosis in the proximal left circumflex coronary artery, treated with a 2.5 x 8 mm Resolute DES.  He has had complete resolution of angina and is able to perform moderately intense physical activity (he is remodeling his new home) without any angina or dyspnea.  He has a variety of musculoskeletal complaints that seem to be mostly related to minor injuries during his move.  He is wondering whether they are due to the statin, but he complains of his joints (bilateral thumbs, knee) rather than muscle aches.  His baseline untreated cholesterol was actually not bad (total cholesterol 159, LDL 94), but he has a severely low HDL cholesterol 23 and triglycerides of 247, consistent with insulin resistance/metabolic syndrome X.  Fasting glucose was 95.  He is mildly overweight with a BMI of about 27-28.  His angina was represented by discomfort in his left jaw and left arm as well as the retrosternal area: He motions to the center of his chest with a clawlike movement and describes the sensation as an  elephant standing on his chest.  The nuclear stress test also showed ischemia in the diagonal territory and an ejection fraction of 46%.  At the time of his cardiac catheterization the diagonal artery was found to be totally occluded with good collaterals.  Left ventricular systolic function is normal with an ejection fraction of 55%, but the LVEDP was slightly elevated at 20-23.  He reports bruising easily but otherwise has not had any bleeding complication and is compliant with dual antiplatelet therapy, beta-blocker, statin.  He has trigeminal neuralgia (sharp shooting pain on the right side of his jaw, currently well controlled on gabapentin and Trileptal.  He otherwise considers himself fit and healthy.  His wife Kevin Browning is a Scientist, research (physical sciences).  They are originally from [place].  Their oldest son Max is currently in medical school.  His younger brother had multivessel coronary bypass surgery about the same time that [name] received this stent.  Past Medical History:  Diagnosis Date  . CAD (coronary artery disease) 09/2013  . HTN (hypertension)     Past Surgical History:  Procedure Laterality Date  . CORONARY STENT INTERVENTION N/A 09/14/2019   Procedure: CORONARY STENT INTERVENTION;  Surgeon: Lennette Bihari, MD;  Location[facility] INVASIVE CV LAB;  Service: Cardiovascular;  Laterality: N/A;  . LEFT HEART CATH AND CORONARY ANGIOGRAPHY N/A 09/14/2019   Procedure: LEFT HEART CATH AND CORONARY ANGIOGRAPHY;  Surgeon: Lennette Bihari, MD;  Location: [facility] INVASIVE CV LAB;  Service: Cardiovascular;  Laterality: N/A;    Current Medications: Current Meds  Medication Sig  .  aspirin EC 81 MG tablet Take 81 mg by mouth daily.  Marland Kitchen atorvastatin (LIPITOR) 40 MG tablet Take 1 tablet (40 mg total) by mouth daily.  Marland Kitchen gabapentin (NEURONTIN) 600 MG tablet Take 600 mg by mouth 2 (two) times daily.   . metoprolol succinate (TOPROL-XL) 25 MG 24 hr tablet Take 1 tablet (25 mg total) by mouth daily. Take  with or immediately following a meal. (Patient taking differently: Take 25 mg by mouth at bedtime. )  . oxcarbazepine (TRILEPTAL) 600 MG tablet Take 600 mg by mouth 2 (two) times daily.   . ticagrelor (BRILINTA) 90 MG TABS tablet Take 1 tablet (90 mg total) by mouth 2 (two) times daily.  . [DISCONTINUED] atorvastatin (LIPITOR) 80 MG tablet Take 1 tablet (80 mg total) by mouth daily.     Allergies:   Patient has no known allergies.   Social History   Socioeconomic History  . Marital status: Married    Spouse name: Not on file  . Number of children: Not on file  . Years of education: Not on file  . Highest education level: Not on file  Occupational History  . Not on file  Tobacco Use  . Smoking status: Never Smoker  . Smokeless tobacco: Never Used  Substance and Sexual Activity  . Alcohol use: Never  . Drug use: Never  . Sexual activity: Not on file  Other Topics Concern  . Not on file  Social History Narrative  . Not on file   Social Determinants of Health   Financial Resource Strain:   . Difficulty of Paying Living Expenses: Not on file  Food Insecurity:   . Worried About Charity fundraiser in the Last Year: Not on file  . Ran Out of Food in the Last Year: Not on file  Transportation Needs:   . Lack of Transportation (Medical): Not on file  . Lack of Transportation (Non-Medical): Not on file  Physical Activity:   . Days of Exercise per Week: Not on file  . Minutes of Exercise per Session: Not on file  Stress:   . Feeling of Stress : Not on file  Social Connections:   . Frequency of Communication with Friends and Family: Not on file  . Frequency of Social Gatherings with Friends and Family: Not on file  . Attends Religious Services: Not on file  . Active Member of Clubs or Organizations: Not on file  . Attends Archivist Meetings: Not on file  . Marital Status: Not on file     Family History: The patient's family history includes CAD in his brother;  Cancer - Colon in his father; Diabetes in his mother; Diabetes Mellitus II in his father; Heart failure in his maternal grandmother; Hypertension in his father and mother.  ROS:   Please see the history of present illness.    All other systems are reviewed and are negative.   EKGs/Labs/Other Studies Reviewed:    The following studies were reviewed today: Angiogram/PCI 09/14/2019  EKG:  EKG is not ordered today.  His ECG tracings have consistently showed normal sinus rhythm at rest.  Recent Labs: 09/10/2019: BUN 15; Creatinine, Ser 1.20; Hemoglobin 14.5; Platelets 186; Potassium 4.2; Sodium 143  Recent Lipid Panel    Component Value Date/Time   CHOL 159 09/01/2019 0830   TRIG 247 (H) 09/01/2019 0830   HDL 23 (L) 09/01/2019 0830   CHOLHDL 6.9 (H) 09/01/2019 0830   LDLCALC 94 09/01/2019 0830    Physical Exam:  VS:  BP 130/80   Ht 6' (1.829 m)   Wt 205 lb (93 kg)   BMI 27.80 kg/m     Wt Readings from Last 3 Encounters:  09/22/19 205 lb (93 kg)  09/14/19 210 lb (95.3 kg)  09/10/19 217 lb (98.4 kg)     Unable to examine  ASSESSMENT:    No diagnosis found. PLAN:    In order of problems listed above:  1. CAD: Asymptomatic following PCI and treatment with beta-blockers.  This possible that he might still have angina.  His beta-blocker due to ischemia in the diagonal artery territory.  Reinforced the critical importance of dual antiplatelet therapy for at least 6, preferably 12 months. 2. HTN: Start Bystolic 10 mg once daily. 3. HLP: We will reduce the atorvastatin to 40 mg once daily, add co-Q10 300 mg daily.  If this is still not well-tolerated we might decide to drop to 10 mg daily, since his LDL was only 94 at baseline.  Target LDL definitely less than 70. 4. History of gout: He has some tenderness in his knee, but it does not sound like a full-fledged gout attack but rather posttraumatic inflammation.  He knows he should avoid red meat/shellfish/etc.  Will call back if  there is overt swelling redness and warmth.  Medication Adjustments/Labs and Tests Ordered: Current medicines are reviewed at length with the patient today.  Concerns regarding medicines are outlined above.  No orders of the defined types were placed in this encounter.  Meds ordered this encounter  Medications  . atorvastatin (LIPITOR) 40 MG tablet    Sig: Take 1 tablet (40 mg total) by mouth daily.    Dispense:  30 tablet    Refill:  0    Note dose change. Patient will contact when he needs refills  . Coenzyme Q10 300 MG CAPS    Sig: Take 1 capsule (300 mg total) by mouth daily.    Refill:  0    Patient Instructions  Medication Instructions:  DECREASE the Atorvastatin to 40 mg once daily START CoQ10 300 mg once daily  *If you need a refill on your cardiac medications before your next appointment, please call your pharmacy*   Lab Work: None ordered If you have labs (blood work) drawn today and your tests are completely normal, you will receive your results only by: Marland Kitchen MyChart Message (if you have MyChart) OR . A paper copy in the mail If you have any lab test that is abnormal or we need to change your treatment, we will call you to review the results.   Testing/Procedures: None ordered   Follow-Up: At [facility], you and your health needs are our priority.  As part of our continuing mission to provide you with exceptional heart care, we have created designated Provider Care Teams.  These Care Teams include your primary Cardiologist (physician) and Advanced Practice Providers (APPs -  Physician Assistants and Nurse Practitioners) who all work together to provide you with the care you need, when you need it.  We recommend signing up for the patient portal called "MyChart".  Sign up information is provided on this After Visit Summary.  MyChart is used to connect with patients for Virtual Visits (Telemedicine).  Patients are able to view lab/test results, encounter notes,  upcoming appointments, etc.  Non-urgent messages can be sent to your provider as well.   To learn more about what you can do with MyChart, go to [email or web address].    Your next  appointment:   6 month(s)  The format for your next appointment:   In Person  Provider:   Thurmon Fair, MD      Signed, Thurmon Fair, MD  09/22/2019 8:53 AM    [facility]

## 2019-09-22 NOTE — Patient Instructions (Signed)
Medication Instructions:  DECREASE the Atorvastatin to 40 mg once daily START CoQ10 300 mg once daily  *If you need a refill on your cardiac medications before your next appointment, please call your pharmacy*   Lab Work: None ordered If you have labs (blood work) drawn today and your tests are completely normal, you will receive your results only by: Marland Kitchen MyChart Message (if you have MyChart) OR . A paper copy in the mail If you have any lab test that is abnormal or we need to change your treatment, we will call you to review the results.   Testing/Procedures: None ordered   Follow-Up: At [facility], you and your health needs are our priority.  As part of our continuing mission to provide you with exceptional heart care, we have created designated Provider Care Teams.  These Care Teams include your primary Cardiologist (physician) and Advanced Practice Providers (APPs -  Physician Assistants and Nurse Practitioners) who all work together to provide you with the care you need, when you need it.  We recommend signing up for the patient portal called "MyChart".  Sign up information is provided on this After Visit Summary.  MyChart is used to connect with patients for Virtual Visits (Telemedicine).  Patients are able to view lab/test results, encounter notes, upcoming appointments, etc.  Non-urgent messages can be sent to your provider as well.   To learn more about what you can do with MyChart, go to [email or web address].    Your next appointment:   6 month(s)  The format for your next appointment:   In Person  Provider:   Thurmon Fair, MD

## 2019-09-22 NOTE — Telephone Encounter (Signed)
Virtual Visit Pre-Appointment Phone Call  "(Name), I am calling you today to discuss your upcoming appointment. We are currently trying to limit exposure to the virus that causes COVID-19 by seeing patients at home rather than in the office."  1. "What is the BEST phone number to call the day of the visit?" - include this in appointment notes  2. "Do you have or have access to (through a family member/friend) a smartphone with video capability that we can use for your visit?" a. If yes - list this number in appt notes as "cell" (if different from BEST phone #) and list the appointment type as a VIDEO visit in appointment notes b. If no - list the appointment type as a PHONE visit in appointment notes  3. Confirm consent - "In the setting of the current Covid19 crisis, you are scheduled for a (phone or video) visit with your provider on (date) at (time).  Just as we do with many in-office visits, in order for you to participate in this visit, we must obtain consent.  If you'd like, I can send this to your mychart (if signed up) or email for you to review.  Otherwise, I can obtain your verbal consent now.  All virtual visits are billed to your insurance company just like a normal visit would be.  By agreeing to a virtual visit, we'd like you to understand that the technology does not allow for your provider to perform an examination, and thus may limit your provider's ability to fully assess your condition. If your provider identifies any concerns that need to be evaluated in person, we will make arrangements to do so.  Finally, though the technology is pretty good, we cannot assure that it will always work on either your or our end, and in the setting of a video visit, we may have to convert it to a phone-only visit.  In either situation, we cannot ensure that we have a secure connection.  Are you willing to proceed?" YES  4. Advise patient to be prepared - "Two hours prior to your appointment, go  ahead and check your blood pressure, pulse, oxygen saturation, and your weight (if you have the equipment to check those) and write them all down. When your visit starts, your provider will ask you for this information. If you have an Apple Watch or Kardia device, please plan to have heart rate information ready on the day of your appointment. Please have a pen and paper handy nearby the day of the visit as well."  5. Give patient instructions for MyChart download to smartphone OR [email or web address] as below if video visit (depending on what platform provider is using)  6. Inform patient they will receive a phone call 15 minutes prior to their appointment time (may be from unknown caller ID) so they should be prepared to answer    TELEPHONE CALL NOTE  Jeison Delpilar has been deemed a candidate for a follow-up tele-health visit to limit community exposure during the Covid-19 pandemic. I spoke with the patient via phone to ensure availability of phone/video source, confirm preferred email & phone number, and discuss instructions and expectations.  I reminded Mateus Rewerts to be prepared with any vital sign and/or heart rhythm information that could potentially be obtained via home monitoring, at the time of his visit. I reminded Eural Holzschuh to expect a phone call prior to his visit.  Ricci Barker, RN 09/22/2019 8:27 AM   INSTRUCTIONS FOR DOWNLOADING THE  MYCHART APP TO SMARTPHONE  - The patient must first make sure to have activated MyChart and know their login information - If Apple, go to [employer] and type in MyChart in the search bar and download the app. If Android, ask patient to go to [employer] and type in [place] in the search bar and download the app. The app is free but as with any other app downloads, their phone may require them to verify saved payment information or Apple/Android password.  - The patient will need to then log into the app with their MyChart username and  password, and select [facility] as their healthcare provider to link the account. When it is time for your visit, go to the MyChart app, find appointments, and click Begin Video Visit. Be sure to Select Allow for your device to access the Microphone and Camera for your visit. You will then be connected, and your provider will be with you shortly.  **If they have any issues connecting, or need assistance please contact MyChart service desk [phone])**  **If using a computer, in order to ensure the best quality for their visit they will need to use either of the following Internet Browsers: [employer], or Google Chrome**  IF USING DOXIMITY or DOXY.ME - The patient will receive a link just prior to their visit by text.     FULL LENGTH CONSENT FOR TELE-HEALTH VISIT   I hereby voluntarily request, consent and authorize [facility] and its employed or contracted physicians, physician assistants, nurse practitioners or other licensed health care professionals (the Practitioner), to provide me with telemedicine health care services (the "Services") as deemed necessary by the treating Practitioner. I acknowledge and consent to receive the Services by the Practitioner via telemedicine. I understand that the telemedicine visit will involve communicating with the Practitioner through live audiovisual communication technology and the disclosure of certain medical information by electronic transmission. I acknowledge that I have been given the opportunity to request an in-person assessment or other available alternative prior to the telemedicine visit and am voluntarily participating in the telemedicine visit.  I understand that I have the right to withhold or withdraw my consent to the use of telemedicine in the course of my care at any time, without affecting my right to future care or treatment, and that the Practitioner or I may terminate the telemedicine visit at any time. I  understand that I have the right to inspect all information obtained and/or recorded in the course of the telemedicine visit and may receive copies of available information for a reasonable fee.  I understand that some of the potential risks of receiving the Services via telemedicine include:  Marland Kitchen Delay or interruption in medical evaluation due to technological equipment failure or disruption; . Information transmitted may not be sufficient (e.g. poor resolution of images) to allow for appropriate medical decision making by the Practitioner; and/or  . In rare instances, security protocols could fail, causing a breach of personal health information.  Furthermore, I acknowledge that it is my responsibility to provide information about my medical history, conditions and care that is complete and accurate to the best of my ability. I acknowledge that Practitioner's advice, recommendations, and/or decision may be based on factors not within their control, such as incomplete or inaccurate data provided by me or distortions of diagnostic images or specimens that may result from electronic transmissions. I understand that the practice of medicine is not an exact science and that Practitioner makes  no warranties or guarantees regarding treatment outcomes. I acknowledge that I will receive a copy of this consent concurrently upon execution via email to the email address I last provided but may also request a printed copy by calling the office of [facility].    I understand that my insurance will be billed for this visit.   I have read or had this consent read to me. . I understand the contents of this consent, which adequately explains the benefits and risks of the Services being provided via telemedicine.  . I have been provided ample opportunity to ask questions regarding this consent and the Services and have had my questions answered to my satisfaction. . I give my informed consent for the services to be  provided through the use of telemedicine in my medical care  By participating in this telemedicine visit I agree to the above.

## 2019-09-22 NOTE — Telephone Encounter (Signed)
The patient has been called about the virtual appointment today with Dr. Croitoru. Instructions provided. The AVS will be mailed. The patient verbalized their understanding.  

## 2019-09-23 DIAGNOSIS — M25561 Pain in right knee: Secondary | ICD-10-CM | POA: Diagnosis not present

## 2019-09-23 DIAGNOSIS — M7041 Prepatellar bursitis, right knee: Secondary | ICD-10-CM | POA: Diagnosis not present

## 2019-09-27 DIAGNOSIS — M25561 Pain in right knee: Secondary | ICD-10-CM | POA: Diagnosis not present

## 2019-09-28 ENCOUNTER — Encounter (HOSPITAL_COMMUNITY): Payer: Self-pay

## 2019-09-28 NOTE — Telephone Encounter (Signed)
Attempted to call patient in regards to Cardiac Rehab - LM on VM Mailed letter 

## 2019-10-12 NOTE — Telephone Encounter (Signed)
No response from pt.  Closed referral  

## 2019-11-12 DIAGNOSIS — Z125 Encounter for screening for malignant neoplasm of prostate: Secondary | ICD-10-CM | POA: Diagnosis not present

## 2019-11-12 DIAGNOSIS — Z79899 Other long term (current) drug therapy: Secondary | ICD-10-CM | POA: Diagnosis not present

## 2019-11-12 DIAGNOSIS — M79671 Pain in right foot: Secondary | ICD-10-CM | POA: Diagnosis not present

## 2020-01-06 ENCOUNTER — Encounter: Payer: Self-pay | Admitting: *Deleted

## 2020-01-08 ENCOUNTER — Other Ambulatory Visit: Payer: Self-pay | Admitting: Cardiovascular Disease

## 2020-01-11 ENCOUNTER — Encounter: Payer: Self-pay | Admitting: Diagnostic Neuroimaging

## 2020-01-11 ENCOUNTER — Ambulatory Visit (INDEPENDENT_AMBULATORY_CARE_PROVIDER_SITE_OTHER): Payer: BC Managed Care – PPO | Admitting: Diagnostic Neuroimaging

## 2020-01-11 ENCOUNTER — Telehealth: Payer: Self-pay | Admitting: Diagnostic Neuroimaging

## 2020-01-11 ENCOUNTER — Other Ambulatory Visit: Payer: Self-pay

## 2020-01-11 VITALS — BP 122/78 | HR 76 | Ht 72.0 in | Wt 189.0 lb

## 2020-01-11 DIAGNOSIS — G500 Trigeminal neuralgia: Secondary | ICD-10-CM

## 2020-01-11 MED ORDER — BACLOFEN 10 MG PO TABS: 10.0000 mg | ORAL_TABLET | Freq: Two times a day (BID) | ORAL | 6 refills | Status: DC

## 2020-01-11 NOTE — Progress Notes (Signed)
[facility]  PATIENT: Kevin Browning DOB: Dec 09, 1965  REFERRING CLINICIAN: Kerin Salen., MD HISTORY FROM: patient  REASON FOR VISIT: new consult    HISTORICAL  CHIEF COMPLAINT:  Chief Complaint  Patient presents with  . Trigeminal neuralgia, right side of face    rm 6 New Pt wife- [name], "[place] for few [place], Trileptal no longer works"    HISTORY OF PRESENT ILLNESS:   54 year old male here for evaluation of trigeminal neuralgia.  Around 2010 patient was diagnosed with migraine headaches with intense right sided maxillary pain pressure nausea seeing black dots.  He was tried on Imitrex with some relief.  Headaches were intermittent.  Around 2015 patient had a change in his headaches.  Migraines stopped happening but instead he started to have lancinating electrical shocklike sensations through his right mandibular and maxillary region.  Symptoms could be triggered by light touch, cold air, cold weather, eating or drinking.  Intense pain could last for 5 to 10 minutes at a time continuously and then intermittently with shocklike sensations.  Symptoms would be cyclical affecting him from November until April every year.  He was tried on carbamazepine but this caused rash.  He was switched to gabapentin and oxcarbazepine which seemed to help.  He would take this medicine for 6 months or so at a time and symptoms would resolve.  Then patient will take a break from medication until pain would return, and then he would restart medications.  Patient has moved to a variety of locations and been treated by various doctors over the past 5 years.  These include doctors in [place], [place] in [place].  Since this past November symptoms have worsened.  Since March or April symptoms have been particularly intense.  Unfortunately gabapentin and oxcarbazepine are not working anymore.  He stopped oxcarbazepine due to development of some rash and also ineffectiveness of  medication.  He was started on lamotrigine but this also has not helped.   REVIEW OF SYSTEMS: Full 14 system review of systems performed and negative with exception of: As per HPI.  ALLERGIES: No Known Allergies  HOME MEDICATIONS: Outpatient Medications Prior to Visit  Medication Sig Dispense Refill  . aspirin EC 81 MG tablet Take 81 mg by mouth daily.    Marland Kitchen atorvastatin (LIPITOR) 40 MG tablet Take 1 tablet (40 mg total) by mouth daily. 30 tablet 0  . gabapentin (NEURONTIN) 600 MG tablet Take 600 mg by mouth 2 (two) times daily.     Marland Kitchen lamoTRIgine (LAMICTAL) 100 MG tablet Take 100 mg by mouth 2 (two) times daily.    . metoprolol succinate (TOPROL-XL) 25 MG 24 hr tablet Take 1 tablet (25 mg total) by mouth daily. Take with or immediately following a meal. (Patient taking differently: Take 25 mg by mouth at bedtime. ) 30 tablet 3  . ascorbic acid (VITAMIN C) 500 MG tablet Take 500 mg by mouth 3 (three) times a week. (Patient not taking: Reported on 01/11/2020)    . cholecalciferol (VITAMIN D3) 25 MCG (1000 UNIT) tablet Take 1,000 Units by mouth 3 (three) times a week. (Patient not taking: Reported on 01/11/2020)    . Coenzyme Q10 300 MG CAPS Take 1 capsule (300 mg total) by mouth daily. (Patient not taking: Reported on 01/11/2020)  0  . oxcarbazepine (TRILEPTAL) 600 MG tablet Take 600 mg by mouth 2 (two) times daily.  (Patient not taking: Reported on 01/11/2020)    . ticagrelor (BRILINTA) 90 MG TABS tablet Take 1  tablet (90 mg total) by mouth 2 (two) times daily. 60 tablet 2   No facility-administered medications prior to visit.    PAST MEDICAL HISTORY: Past Medical History:  Diagnosis Date  . CAD (coronary artery disease) 09/2013  . Heart disease   . HTN (hypertension)   . Migraines   . Trigeminal neuralgia of right side of face     PAST SURGICAL HISTORY: Past Surgical History:  Procedure Laterality Date  . CORONARY STENT INTERVENTION N/A 09/14/2019   Procedure: CORONARY STENT  INTERVENTION;  Surgeon: Lennette Bihari, MD;  Location: [facility] INVASIVE CV LAB;  Service: Cardiovascular;  Laterality: N/A;  . LEFT HEART CATH AND CORONARY ANGIOGRAPHY N/A 09/14/2019   Procedure: LEFT HEART CATH AND CORONARY ANGIOGRAPHY;  Surgeon: Lennette Bihari, MD;  Location: [facility] INVASIVE CV LAB;  Service: Cardiovascular;  Laterality: N/A;    FAMILY HISTORY: Family History  Problem Relation Age of Onset  . Diabetes Mother        type 2  . Hypertension Mother   . Cancer - Colon Father   . Diabetes Mellitus II Father   . Hypertension Father   . CAD Brother        bypass x4  . Heart failure Maternal Grandmother     SOCIAL HISTORY: Social History   Socioeconomic History  . Marital status: Married    Spouse name: [name]  . Number of children: 3  . Years of education: 54  . Highest education level: Not on file  Occupational History    Comment: NA  Tobacco Use  . Smoking status: Never Smoker  . Smokeless tobacco: Never Used  Substance and Sexual Activity  . Alcohol use: Never  . Drug use: Never  . Sexual activity: Not on file  Other Topics Concern  . Not on file  Social History Narrative   Lives with family   Caffeine- some   Social Determinants of Health   Financial Resource Strain:   . Difficulty of Paying Living Expenses:   Food Insecurity:   . Worried About Programme researcher, broadcasting/film/video in the Last Year:   . Barista in the Last Year:   Transportation Needs:   . Freight forwarder (Medical):   Marland Kitchen Lack of Transportation (Non-Medical):   Physical Activity:   . Days of Exercise per Week:   . Minutes of Exercise per Session:   Stress:   . Feeling of Stress :   Social Connections:   . Frequency of Communication with Friends and Family:   . Frequency of Social Gatherings with Friends and Family:   . Attends Religious Services:   . Active Member of Clubs or Organizations:   . Attends Banker Meetings:   Marland Kitchen Marital Status:   Intimate Partner Violence:    . Fear of Current or Ex-Partner:   . Emotionally Abused:   Marland Kitchen Physically Abused:   . Sexually Abused:      PHYSICAL EXAM  GENERAL EXAM/CONSTITUTIONAL: Vitals:  Vitals:   01/11/20 1422  BP: 122/78  Pulse: 76  Weight: 189 lb (85.7 kg)  Height: 6' (1.829 m)     Body mass index is 25.63 kg/m. Wt Readings from Last 3 Encounters:  01/11/20 189 lb (85.7 kg)  09/22/19 205 lb (93 kg)  09/14/19 210 lb (95.3 kg)     Patient is in no distress; well developed, nourished and groomed; neck is supple  CARDIOVASCULAR:  Examination of carotid arteries is normal; no carotid bruits  Regular rate and rhythm, no murmurs  Examination of peripheral vascular system by observation and palpation is normal  EYES:  Ophthalmoscopic exam of optic discs and posterior segments is normal; no papilledema or hemorrhages  No exam data present  MUSCULOSKELETAL:  Gait, strength, tone, movements noted in Neurologic exam below  NEUROLOGIC: MENTAL STATUS:  No flowsheet data found.  awake, alert, oriented to person, place and time  recent and remote memory intact  normal attention and concentration  language fluent, comprehension intact, naming intact  fund of knowledge appropriate  CRANIAL NERVE:   2nd - no papilledema on fundoscopic exam  2nd, 3rd, 4th, 6th - pupils equal and reactive to light, visual fields full to confrontation, extraocular muscles intact, no nystagmus  5th - facial sensation symmetric --> VERY SENSITIVE TO TOUCH IN RIGHT V2, V3  7th - facial strength symmetric  8th - hearing intact  9th - palate elevates symmetrically, uvula midline  11th - shoulder shrug symmetric  12th - tongue protrusion midline  MOTOR:   normal bulk and tone, full strength in the BUE, BLE  SENSORY:   normal and symmetric to light touch, temperature, vibration  COORDINATION:   finger-nose-finger, fine finger movements normal  REFLEXES:   deep tendon reflexes present and  symmetric  GAIT/STATION:   narrow based gait     DIAGNOSTIC DATA (LABS, IMAGING, TESTING) - I reviewed patient records, labs, notes, testing and imaging myself where available.  Lab Results  Component Value Date   WBC 4.2 09/10/2019   HGB 14.5 09/10/2019   HCT 42.7 09/10/2019   MCV 85 09/10/2019   PLT 186 09/10/2019      Component Value Date/Time   NA 143 09/10/2019 1237   K 4.2 09/10/2019 1237   CL 108 (H) 09/10/2019 1237   CO2 20 09/10/2019 1237   GLUCOSE 95 09/10/2019 1237   BUN 15 09/10/2019 1237   CREATININE 1.20 09/10/2019 1237   CALCIUM 9.2 09/10/2019 1237   GFRNONAA 68 09/10/2019 1237   GFRAA 79 09/10/2019 1237   Lab Results  Component Value Date   CHOL 159 09/01/2019   HDL 23 (L) 09/01/2019   LDLCALC 94 09/01/2019   TRIG 247 (H) 09/01/2019   CHOLHDL 6.9 (H) 09/01/2019   No results found for: HGBA1C No results found for: VITAMINB12 No results found for: TSH     ASSESSMENT AND PLAN  54 y.o. year old male here with:  Dx:  1. Right trigeminal neuralgia      PLAN:  RIGHT FACIAL PAIN / TRIGEMINAL NEURALGIA (intractable; tried gabapentin, OXC, CBZ) - continue gabapentin, lamotrigine - check MRI brain w/wo - refer to neurosurgery for gamma knife or microvascular decompression evaluation  Orders Placed This Encounter  Procedures  . MR BRAIN W WO CONTRAST  . MR FACE/TRIGEMINAL WO/W CM   Meds ordered this encounter  Medications  . baclofen (LIORESAL) 10 MG tablet    Sig: Take 1 tablet (10 mg total) by mouth 2 (two) times daily.    Dispense:  60 each    Refill:  6   Return in about 6 months (around 07/12/2020).    Suanne Marker, MD 01/11/2020, 3:05 PM Certified in Neurology, Neurophysiology and Neuroimaging  [facility] [address], [address] [place], [place] [address] [phone]

## 2020-01-11 NOTE — Telephone Encounter (Signed)
Called [facility] relayed patient was in a lot of pain Right TN pain; intractable and severe; urgent . [facility] Relayed to me to please fax notes and as soon as patient had MRI they would need that first. I have faxed notes to Neuro Surgery. Telephone [phone] - Fax [phone].  I called patient and relayed message to him also relayed we had to get authorization on MRI and we would get him apt for MRI ASAP. Patient understood all details.

## 2020-01-12 ENCOUNTER — Other Ambulatory Visit: Payer: Self-pay

## 2020-01-12 ENCOUNTER — Ambulatory Visit: Payer: BC Managed Care – PPO

## 2020-01-12 DIAGNOSIS — G500 Trigeminal neuralgia: Secondary | ICD-10-CM

## 2020-01-12 NOTE — Telephone Encounter (Signed)
Noted, I spoke to the patient he is scheduled at [facility] for 01/12/20.   BCBS Auth: [id number] (exp. 01/12/20 to 07/09/20)

## 2020-01-13 NOTE — Telephone Encounter (Signed)
I spoke with the patient he is rescheduled for 01/18/20 at [facility].

## 2020-01-13 NOTE — Addendum Note (Signed)
Addended by: Joycelyn Schmid R on: 01/13/2020 08:11 AM   Modules accepted: Orders

## 2020-01-13 NOTE — Telephone Encounter (Signed)
Due to the power going out the patient was not able to finish his exam. When you get a chance can you put a new MRI brain w/wo contrast order in. Thank you!

## 2020-01-18 ENCOUNTER — Ambulatory Visit (INDEPENDENT_AMBULATORY_CARE_PROVIDER_SITE_OTHER): Payer: BC Managed Care – PPO

## 2020-01-18 DIAGNOSIS — G500 Trigeminal neuralgia: Secondary | ICD-10-CM | POA: Diagnosis not present

## 2020-01-18 MED ORDER — GADOBENATE DIMEGLUMINE 529 MG/ML IV SOLN
15.0000 mL | Freq: Once | INTRAVENOUS | Status: AC | PRN
  Administered 2020-01-18: 15 mL via INTRAVENOUS

## 2020-01-19 NOTE — Telephone Encounter (Signed)
Spoke with patient and informed him of Dr Penumalli's result note. He had referral placed to [facility] on 01/11/20, and they are waiting on MRI results. I advised will let Annabelle Harman know his MRI results are in. He'll get a call from our office or [facility] to schedule Neurosurgery consult. Patient verbalized understanding, appreciation.

## 2020-01-19 NOTE — Telephone Encounter (Signed)
Pt called wanting to know if his MRI results are in and if not if he can be called in as soon as they get in. Please advise.

## 2020-01-19 NOTE — Telephone Encounter (Signed)
Called and spoke to patient and relayed that I will leave his MRI disc for him to pick up at the front desk for him to take to [facility] . I have faxed [facility] a copy of MRI report. Neurosurgery will call and get him scheduled now.

## 2020-01-19 NOTE — Telephone Encounter (Signed)
MRI shows small blood vessel near right trigeminal nerve. May cause some pressure on the nerve. Recommend NSGY eval at [facility] for eval. -VRP

## 2020-02-17 ENCOUNTER — Telehealth: Payer: Self-pay | Admitting: Diagnostic Neuroimaging

## 2020-02-17 MED ORDER — LAMOTRIGINE 100 MG PO TABS: 100.0000 mg | ORAL_TABLET | Freq: Two times a day (BID) | ORAL | 12 refills | Status: DC

## 2020-02-17 NOTE — Telephone Encounter (Signed)
Called patient who stated Lamictal was prescribed for his trigeminal neuralgia by PCP, but his PCP advised to have neurologist refill now that patent is established with specialist.  Patient has consulted with Dr Angelyn Punt and Dr Johny Drilling about possible surgeries. He and his wife are making a decision. We scheduled his 6 Month FU here. He stated he will run out of lamictal tonight, stated he cannot do without it. I advised will send request to Dr Marjory Lies. Patient verbalized understanding, appreciation.

## 2020-02-17 NOTE — Telephone Encounter (Signed)
Meds ordered this encounter  Medications  . lamoTRIgine (LAMICTAL) 100 MG tablet    Sig: Take 1 tablet (100 mg total) by mouth 2 (two) times daily.    Dispense:  60 tablet    Refill:  12    Suanne Marker, MD 02/17/2020, 11:42 AM Certified in Neurology, Neurophysiology and Neuroimaging  [facility] [address] [place], [place] [address] [phone]

## 2020-02-17 NOTE — Telephone Encounter (Signed)
Called patient and informed him Dr Marjory Lies sent in refill. Patient verbalized understanding, appreciation.

## 2020-02-17 NOTE — Telephone Encounter (Signed)
Pt refill lamoTRIgine (LAMICTAL) 100 MG tablet at [facility] at [facility]. Pt only have 1 day of medication left. Pt would like a phone call from the nurse.

## 2020-02-23 ENCOUNTER — Telehealth: Payer: Self-pay | Admitting: Diagnostic Neuroimaging

## 2020-02-23 MED ORDER — LAMOTRIGINE 100 MG PO TABS: 200.0000 mg | ORAL_TABLET | Freq: Two times a day (BID) | ORAL | 6 refills | Status: DC

## 2020-02-23 MED ORDER — BACLOFEN 10 MG PO TABS: 20.0000 mg | ORAL_TABLET | Freq: Two times a day (BID) | ORAL | 6 refills | Status: DC

## 2020-02-23 MED ORDER — BACLOFEN 10 MG PO TABS: 10.0000 mg | ORAL_TABLET | Freq: Two times a day (BID) | ORAL | 1 refills | Status: DC

## 2020-02-23 MED ORDER — LAMOTRIGINE 100 MG PO TABS: 100.0000 mg | ORAL_TABLET | Freq: Two times a day (BID) | ORAL | 1 refills | Status: DC

## 2020-02-23 NOTE — Telephone Encounter (Addendum)
Meds ordered this encounter  Medications  . DISCONTD: baclofen (LIORESAL) 10 MG tablet    Sig: Take 1 tablet (10 mg total) by mouth 2 (two) times daily.    Dispense:  180 each    Refill:  1  . DISCONTD: lamoTRIgine (LAMICTAL) 100 MG tablet    Sig: Take 1 tablet (100 mg total) by mouth 2 (two) times daily.    Dispense:  180 tablet    Refill:  1  . lamoTRIgine (LAMICTAL) 100 MG tablet    Sig: Take 2 tablets (200 mg total) by mouth 2 (two) times daily.    Dispense:  120 tablet    Refill:  6    Please discard previous orders  . baclofen (LIORESAL) 10 MG tablet    Sig: Take 2 tablets (20 mg total) by mouth 2 (two) times daily.    Dispense:  120 each    Refill:  6    Please discard previous orders

## 2020-02-23 NOTE — Addendum Note (Signed)
Addended by: Levert Feinstein on: 02/23/2020 02:31 PM   Modules accepted: Orders

## 2020-02-23 NOTE — Telephone Encounter (Signed)
Called patient who stated a week ago he increased lamotrigine and baclofen to two tabs twice daily for his trigeminal nerve pain. He stated the pain is now bearable but he is running out of medications. He will be scheduled for MVD surgery at [facility] soon. He is asking for a temporary increase in these medications until he has surgery in approximately 2-3 weeks. I informed him Dr Marjory Lies is out of office, will send to Dr Terrace Arabia. Patient verbalized understanding, appreciation.

## 2020-02-23 NOTE — Telephone Encounter (Signed)
Pt called wanting to see if his medication dosages can be changed. Pt would like to know if his lamoTRIgine (LAMICTAL) 100 MG tablet can be changed to 200mg  one in the am and one in the pm And his baclofen (LIORESAL) 10 MG tablet to 2 tablets by mouth BID Please advise.

## 2020-03-15 ENCOUNTER — Telehealth: Payer: Self-pay | Admitting: Diagnostic Neuroimaging

## 2020-03-15 NOTE — Telephone Encounter (Signed)
Records faxed to [facility] 03/15/2020.

## 2020-03-21 ENCOUNTER — Other Ambulatory Visit: Payer: Self-pay

## 2020-03-21 ENCOUNTER — Telehealth: Payer: Self-pay | Admitting: *Deleted

## 2020-03-21 ENCOUNTER — Encounter: Payer: Self-pay | Admitting: Cardiovascular Disease

## 2020-03-21 ENCOUNTER — Ambulatory Visit (INDEPENDENT_AMBULATORY_CARE_PROVIDER_SITE_OTHER): Payer: BC Managed Care – PPO | Admitting: Cardiovascular Disease

## 2020-03-21 VITALS — BP 114/62 | HR 62 | Ht 72.0 in | Wt 187.6 lb

## 2020-03-21 DIAGNOSIS — I10 Essential (primary) hypertension: Secondary | ICD-10-CM | POA: Diagnosis not present

## 2020-03-21 DIAGNOSIS — Z01810 Encounter for preprocedural cardiovascular examination: Secondary | ICD-10-CM

## 2020-03-21 DIAGNOSIS — I25118 Atherosclerotic heart disease of native coronary artery with other forms of angina pectoris: Secondary | ICD-10-CM | POA: Diagnosis not present

## 2020-03-21 DIAGNOSIS — Z8739 Personal history of other diseases of the musculoskeletal system and connective tissue: Secondary | ICD-10-CM | POA: Diagnosis not present

## 2020-03-21 DIAGNOSIS — E785 Hyperlipidemia, unspecified: Secondary | ICD-10-CM

## 2020-03-21 NOTE — Telephone Encounter (Signed)
Clearance letter has been faxed to Dr. Angelyn Punt at [phone]. Please see letter in epic for details.

## 2020-03-21 NOTE — Patient Instructions (Signed)
Medication Instructions:  No changes *If you need a refill on your cardiac medications before your next appointment, please call your pharmacy*   Lab Work: Your provider would like for you to have the following labs today: Fasting Lipid  If you have labs (blood work) drawn today and your tests are completely normal, you will receive your results only by: Marland Kitchen MyChart Message (if you have MyChart) OR . A paper copy in the mail If you have any lab test that is abnormal or we need to change your treatment, we will call you to review the results.   Testing/Procedures: None ordered   Follow-Up: At [facility], you and your health needs are our priority.  As part of our continuing mission to provide you with exceptional heart care, we have created designated Provider Care Teams.  These Care Teams include your primary Cardiologist (physician) and Advanced Practice Providers (APPs -  Physician Assistants and Nurse Practitioners) who all work together to provide you with the care you need, when you need it.  We recommend signing up for the patient portal called "MyChart".  Sign up information is provided on this After Visit Summary.  MyChart is used to connect with patients for Virtual Visits (Telemedicine).  Patients are able to view lab/test results, encounter notes, upcoming appointments, etc.  Non-urgent messages can be sent to your provider as well.   To learn more about what you can do with MyChart, go to [email or web address].    Your next appointment:   6 month(s)  The format for your next appointment:   In Person  Provider:   You may see Thurmon Fair, MD or one of the following Advanced Practice Providers on your designated Care Team:    Azalee Course, PA-C  Micah Flesher, PA-C or   Judy Pimple, [place]

## 2020-03-21 NOTE — Progress Notes (Signed)
Cardiology Office Note:    Date:  03/21/2020   ID:  Kevin Browning, DOB 10-21-1965, MRN [id number]  PCP:  Patient, No Pcp Per  Cardiologist:  Kevin Fair, MD NEW Electrophysiologist:  None   Referring MD: No ref. provider found   Chief Complaint  Patient presents with  . Coronary Artery Disease    History of Present Illness:    Kevin Browning is a 54 y.o. male with a hx of low HDL dyslipidemia and coronary artery disease, initially presenting with exertional angina in March 2015 ( 95% LAD stenosis, treated with placement of a drug-eluting Promus Premier [id number] stent in the proximal LAD artery in [place]), returning with exertional angina in February 2021.  Nuclear stress testing showed 2 areas of ischemia in the inferolateral and anterolateral walls, corresponding to the left circumflex and diagonal arteries respectively.  Cardiac catheterization on 09/14/2019 showed total occlusion of a large diagonal artery filling via collaterals and a 95% stenosis in the proximal left circumflex coronary artery, treated with a 2.5 x 8 mm Resolute DES.  He is done well from a cardiovascular standpoint since that time, able to perform intense physical activity without angina or dyspnea.  He is no longer taking dual antiplatelet therapy and is only on aspirin monotherapy.  He denies any problems with dyspnea either at rest or with activity and does not have claudication, ischemic focal neurological events, palpitations, dizziness or syncope.  He has severe trigeminal neuralgia that is no longer compensated by treatment with gabapentin.  He gets little relief from lamotrigine.  He is planning to have decompression surgery with Dr. Anastasio Auerbach, MD at [facility] on September 17.  His baseline untreated cholesterol was actually not bad (total cholesterol 159, LDL 94), but he has a severely low HDL cholesterol 23 and triglycerides of 247, consistent with insulin resistance/metabolic syndrome X.  Fasting  glucose was 95.  He is mildly overweight with a BMI of about 27-28.  He has done excellent job with weight loss, dropping about 25 pounds.  His BMI is now 25.  His angina was represented by discomfort in his left jaw and left arm as well as the retrosternal area: He motions to the center of his chest with a clawlike movement and describes the sensation as an elephant standing on his chest.  The nuclear stress test also showed ischemia in the diagonal territory and an ejection fraction of 46%.  At the time of his cardiac catheterization the diagonal artery was found to be totally occluded with good collaterals.  Left ventricular systolic function is normal with an ejection fraction of 55%, but the LVEDP was slightly elevated at 20-23.  His wife Kevin Browning is a Scientist, research (physical sciences).  They are originally from [place].  Their oldest son Kevin Browning is currently in medical school.  His younger brother had multivessel coronary bypass surgery about the same time that [name] received this stent.  Past Medical History:  Diagnosis Date  . CAD (coronary artery disease) 09/2013  . Heart disease   . HTN (hypertension)   . Migraines   . Trigeminal neuralgia of right side of face     Past Surgical History:  Procedure Laterality Date  . CORONARY STENT INTERVENTION N/A 09/14/2019   Procedure: CORONARY STENT INTERVENTION;  Surgeon: Kevin Bihari, MD;  Location: [facility] INVASIVE CV LAB;  Service: Cardiovascular;  Laterality: N/A;  . LEFT HEART CATH AND CORONARY ANGIOGRAPHY N/A 09/14/2019   Procedure: LEFT HEART CATH AND CORONARY ANGIOGRAPHY;  Surgeon:  Kevin Bihari, MD;  Location: [facility] INVASIVE CV LAB;  Service: Cardiovascular;  Laterality: N/A;    Current Medications: Current Meds  Medication Sig  . ascorbic acid (VITAMIN C) 500 MG tablet Take 500 mg by mouth 3 (three) times a week.   Marland Kitchen aspirin EC 81 MG tablet Take 81 mg by mouth daily.  Marland Kitchen atorvastatin (LIPITOR) 40 MG tablet Take 1 tablet (40 mg total) by  mouth daily.  . baclofen (LIORESAL) 10 MG tablet Take 2 tablets (20 mg total) by mouth 2 (two) times daily.  . cholecalciferol (VITAMIN D3) 25 MCG (1000 UNIT) tablet Take 1,000 Units by mouth 3 (three) times a week.   . lamoTRIgine (LAMICTAL) 100 MG tablet Take 2 tablets (200 mg total) by mouth 2 (two) times daily.  . [DISCONTINUED] Coenzyme Q10 300 MG CAPS Take 1 capsule (300 mg total) by mouth daily.  . [DISCONTINUED] gabapentin (NEURONTIN) 300 MG capsule Take 900 mg by mouth 3 (three) times daily.     Allergies:   Patient has no known allergies.   Social History   Socioeconomic History  . Marital status: Married    Spouse name: Kevin Browning  . Number of children: 3  . Years of education: 15  . Highest education level: Not on file  Occupational History    Comment: NA  Tobacco Use  . Smoking status: Never Smoker  . Smokeless tobacco: Never Used  Substance and Sexual Activity  . Alcohol use: Never  . Drug use: Never  . Sexual activity: Not on file  Other Topics Concern  . Not on file  Social History Narrative   Lives with family   Caffeine- some   Social Determinants of Health   Financial Resource Strain:   . Difficulty of Paying Living Expenses: Not on file  Food Insecurity:   . Worried About Programme researcher, broadcasting/film/video in the Last Year: Not on file  . Ran Out of Food in the Last Year: Not on file  Transportation Needs:   . Lack of Transportation (Medical): Not on file  . Lack of Transportation (Non-Medical): Not on file  Physical Activity:   . Days of Exercise per Week: Not on file  . Minutes of Exercise per Session: Not on file  Stress:   . Feeling of Stress : Not on file  Social Connections:   . Frequency of Communication with Friends and Family: Not on file  . Frequency of Social Gatherings with Friends and Family: Not on file  . Attends Religious Services: Not on file  . Active Member of Clubs or Organizations: Not on file  . Attends Banker Meetings: Not on  file  . Marital Status: Not on file     Family History: The patient's family history includes CAD in his brother; Cancer - Colon in his father; Diabetes in his mother; Diabetes Mellitus II in his father; Heart failure in his maternal grandmother; Hypertension in his father and mother.  ROS:   Please see the history of present illness.    All other systems are reviewed and are negative.   EKGs/Labs/Other Studies Reviewed:    The following studies were reviewed today: Angiogram/PCI 09/14/2019  EKG:  EKG is ordered today and is a completely normal tracing.  Normal sinus rhythm.  Recent Labs: 09/10/2019: BUN 15; Creatinine, Ser 1.20; Hemoglobin 14.5; Platelets 186; Potassium 4.2; Sodium 143  Recent Lipid Panel    Component Value Date/Time   CHOL 159 09/01/2019 0830   TRIG 247 (  H) 09/01/2019 0830   HDL 23 (L) 09/01/2019 0830   CHOLHDL 6.9 (H) 09/01/2019 0830   LDLCALC 94 09/01/2019 0830    Physical Exam:    VS:  BP 114/62   Pulse 62   Ht 6' (1.829 m)   Wt 187 lb 9.6 oz (85.1 kg)   SpO2 100%   BMI 25.44 kg/m     Wt Readings from Last 3 Encounters:  03/21/20 187 lb 9.6 oz (85.1 kg)  01/11/20 189 lb (85.7 kg)  09/22/19 205 lb (93 kg)      General: Alert, oriented x3, no distress, appears lean and fit Head: no evidence of trauma, PERRL, EOMI, no exophtalmos or lid lag, no myxedema, no xanthelasma; normal ears, nose and oropharynx Neck: normal jugular venous pulsations and no hepatojugular reflux; brisk carotid pulses without delay and no carotid bruits Chest: clear to auscultation, no signs of consolidation by percussion or palpation, normal fremitus, symmetrical and full respiratory excursions Cardiovascular: normal position and quality of the apical impulse, regular rhythm, normal first and second heart sounds, no murmurs, rubs or gallops Abdomen: no tenderness or distention, no masses by palpation, no abnormal pulsatility or arterial bruits, normal bowel sounds, no  hepatosplenomegaly Extremities: no clubbing, cyanosis or edema; 2+ radial, ulnar and brachial pulses bilaterally; 2+ right femoral, posterior tibial and dorsalis pedis pulses; 2+ left femoral, posterior tibial and dorsalis pedis pulses; no subclavian or femoral bruits Neurological: grossly nonfocal Psych: Normal mood and affect   ASSESSMENT:    1. Coronary artery disease of native artery of native heart with stable angina pectoris (HCC)   2. Essential hypertension   3. Dyslipidemia   4. History of gout   5. Preoperative cardiovascular examination    PLAN:    In order of problems listed above:  1. CAD: Asymptomatic on beta-blocker monotherapy (he might still have exertional angina from his occluded diagonal artery).  His stent was not implanted during an acute coronary insufficiency event.  At this point he should be safe to discontinue his antiplatelet therapy temporarily so that he can have surgery.  Described a small risk of in-stent restenosis which persisted for 12-18 months after the original procedure. 2. HTN: Start Bystolic 10 mg once daily. 3. HLP: Target LDL cholesterol less than 70.  We will take advantage of the preop labs which will be drawn for his upcoming neurosurgery to recheck his lipids. 4. History of gout: No recent episodes. 5. Preop CV exam: Low risk for major cardiovascular complications during planned trigeminal nerve decompression surgery.  He can stop the aspirin for 5-7 days before the procedure.  It is important not to abruptly discontinue his beta-blocker before surgery.  Medication Adjustments/Labs and Tests Ordered: Current medicines are reviewed at length with the patient today.  Concerns regarding medicines are outlined above.  Orders Placed This Encounter  Procedures  . Lipid panel  . EKG 12-Lead   No orders of the defined types were placed in this encounter.   Patient Instructions  Medication Instructions:  No changes *If you need a refill on  your cardiac medications before your next appointment, please call your pharmacy*   Lab Work: Your provider would like for you to have the following labs today: Fasting Lipid  If you have labs (blood work) drawn today and your tests are completely normal, you will receive your results only by: Marland Kitchen MyChart Message (if you have MyChart) OR . A paper copy in the mail If you have any lab test that  is abnormal or we need to change your treatment, we will call you to review the results.   Testing/Procedures: None ordered   Follow-Up: At [facility], you and your health needs are our priority.  As part of our continuing mission to provide you with exceptional heart care, we have created designated Provider Care Teams.  These Care Teams include your primary Cardiologist (physician) and Advanced Practice Providers (APPs -  Physician Assistants and Nurse Practitioners) who all work together to provide you with the care you need, when you need it.  We recommend signing up for the patient portal called "MyChart".  Sign up information is provided on this After Visit Summary.  MyChart is used to connect with patients for Virtual Visits (Telemedicine).  Patients are able to view lab/test results, encounter notes, upcoming appointments, etc.  Non-urgent messages can be sent to your provider as well.   To learn more about what you can do with MyChart, go to [email or web address].    Your next appointment:   6 month(s)  The format for your next appointment:   In Person  Provider:   You may see Kevin FairMihai Lathon Adan, MD or one of the following Advanced Practice Providers on your designated Care Team:    Azalee CourseHao Meng, PA-C  Micah FlesherAngela Duke, [place] JerseyPA-C or   Judy PimpleKrista Kroeger, PA-C     Signed, Kevin FairMihai Travis Mastel, MD  03/21/2020 1:56 PM    [facility]

## 2020-03-28 ENCOUNTER — Telehealth: Payer: Self-pay | Admitting: Diagnostic Neuroimaging

## 2020-03-28 NOTE — Telephone Encounter (Signed)
Called patient and advised Dr Marjory Lies recommended he wait until after surgery. Patient verbalized understanding, appreciation.

## 2020-03-28 NOTE — Telephone Encounter (Signed)
I would wait until after surgery. -VRP

## 2020-03-28 NOTE — Telephone Encounter (Signed)
Pt called, having surgery to fix the problem, when should I start tapering down lamoTRIgine (LAMICTAL) 100 MG tablet. Pt ask should he slow medcation after surgery or  before surgery.Pt would like a call from the nurse.

## 2020-04-09 ENCOUNTER — Other Ambulatory Visit: Payer: Self-pay | Admitting: Cardiovascular Disease

## 2020-04-26 HISTORY — PX: CRANIOTOMY: SHX93

## 2020-05-16 ENCOUNTER — Other Ambulatory Visit: Payer: Self-pay | Admitting: Cardiovascular Disease

## 2020-05-19 ENCOUNTER — Telehealth: Payer: Self-pay | Admitting: Cardiovascular Disease

## 2020-05-19 NOTE — Telephone Encounter (Signed)
Called patient back to let him know Dr. Royann Shivers will be sent a message to see if patient can see lipid clinic and discuss his options. Patient verbalized understanding.

## 2020-05-19 NOTE — Telephone Encounter (Signed)
Returned call to pt he states that he will stop atorvastatin. And he will await Dr Renaye Rakers return for call back

## 2020-05-19 NOTE — Telephone Encounter (Signed)
Pt c/o medication issue:  1. Name of Medication: atorvastatin (LIPITOR) 40 MG tablet [[id number]  2. How are you currently taking this medication (dosage and times per day)? TAKE ONE TABLET BY MOUTH DAILY  3. Are you having a reaction (difficulty breathing--STAT)? No   4. What is your medication issue? Pt stated that he thinks this med is cause muscle aches and would like to know if he could change this med.  He stated that his mother had the same issue and went on some type of shot.  He was not sure what the name of it was.  He would like to try that.   Best number [phone]

## 2020-05-22 NOTE — Telephone Encounter (Signed)
We can definitely set him up for the "shot" for his cholesterol, Repatha or Praluent. Will need updated lipid profile labs before submitting to insurance company. I would wait for about 4 weeks off atorvastatin, then check labs, please.

## 2020-05-22 NOTE — Telephone Encounter (Signed)
Spoke with the patient. He has asked to be set up with an appointment to see Dr. Royann Shivers. An appointment has been made for 06/21/20.

## 2020-06-21 ENCOUNTER — Encounter: Payer: Self-pay | Admitting: Cardiovascular Disease

## 2020-06-21 ENCOUNTER — Ambulatory Visit (INDEPENDENT_AMBULATORY_CARE_PROVIDER_SITE_OTHER): Payer: BC Managed Care – PPO | Admitting: Cardiovascular Disease

## 2020-06-21 ENCOUNTER — Other Ambulatory Visit: Payer: Self-pay

## 2020-06-21 VITALS — BP 136/82 | HR 66 | Ht 73.0 in | Wt 199.0 lb

## 2020-06-21 DIAGNOSIS — I10 Essential (primary) hypertension: Secondary | ICD-10-CM | POA: Diagnosis not present

## 2020-06-21 DIAGNOSIS — I25118 Atherosclerotic heart disease of native coronary artery with other forms of angina pectoris: Secondary | ICD-10-CM | POA: Diagnosis not present

## 2020-06-21 DIAGNOSIS — E785 Hyperlipidemia, unspecified: Secondary | ICD-10-CM | POA: Diagnosis not present

## 2020-06-21 DIAGNOSIS — M8618 Other acute osteomyelitis, other site: Secondary | ICD-10-CM | POA: Diagnosis not present

## 2020-06-21 MED ORDER — ROSUVASTATIN CALCIUM 20 MG PO TABS
20.0000 mg | ORAL_TABLET | Freq: Every day | ORAL | 3 refills | Status: DC
Start: 2020-06-21 — End: 2021-06-20

## 2020-06-21 NOTE — Progress Notes (Signed)
Cardiology Office Note:    Date:  06/21/2020   ID:  Kevin Browning, DOB July 07, 1966, MRN [id number]  PCP:  Patient, No Pcp Per  Cardiologist:  Sanda Klein, MD  Electrophysiologist:  None   Referring MD: No ref. provider found   Chief Complaint  Patient presents with  . Follow-up  CAD  History of Present Illness:    Kevin Browning is a 54 y.o. male with a hx of low HDL dyslipidemia and coronary artery disease, initially presenting with exertional angina in March 2015 ( 95% LAD stenosis, treated with placement of a drug-eluting Promus Premier [id number] stent in the proximal LAD artery in [place]), returning with exertional angina in February 2021.  Nuclear stress testing showed 2 areas of ischemia in the inferolateral and anterolateral walls, corresponding to the left circumflex and diagonal arteries respectively.  Cardiac catheterization on 09/14/2019 showed total occlusion of a large diagonal artery filling via collaterals and a 95% stenosis in the proximal left circumflex coronary artery, treated with a 2.5 x 8 mm Resolute DES.  He is doing very well from a cardiovascular standpoint since his appointment, but has had a lot of issues with his trigeminal neuralgia decompression surgery performed at [facility].  After the original surgery he developed a infection of the bone graft LAD and had to have reviewed surgical drainage with healing by secondary intention.  Cultures grew staph epidermidis and he is receiving broad-spectrum antibiotics (vancomycin and cefepime) via PICC line for total of 6 weeks.  He has a couple of weeks ago.  He has gained some weight due to inability to exercise.  The patient specifically denies any chest pain at rest exertion, dyspnea at rest or with exertion, orthopnea, paroxysmal nocturnal dyspnea, syncope, palpitations, focal neurological deficits, intermittent claudication, lower extremity edema, unexplained weight gain, cough, hemoptysis or wheezing.  He  complains of some discomfort in his left arm and intermittent problems with cyanosis distal to the PICC line insertion site.  These are generally brief and resolve spontaneously or with repositioning of his arm.  He did not tolerate atorvastatin and is currently not receiving any lipid-lowering therapy.  His baseline untreated cholesterol was actually not bad (total cholesterol 159, LDL 94), but he has a severely low HDL cholesterol 23 and triglycerides of 247, consistent with insulin resistance/metabolic syndrome X.  Fasting glucose was 95.  He is mildly overweight with a BMI of about 27-28.  His angina was represented by discomfort in his left jaw and left arm as well as the retrosternal area: He motions to the center of his chest with a clawlike movement and describes the sensation as an elephant standing on his chest.  The nuclear stress test also showed ischemia in the diagonal territory and an ejection fraction of 46%.  At the time of his cardiac catheterization the diagonal artery was found to be totally occluded with good collaterals.  Left ventricular systolic function is normal with an ejection fraction of 55%, but the LVEDP was slightly elevated at 20-23.  His wife Kevin Browning is a Geneticist, molecular.  They are originally from [place].  Their oldest son Kevin Browning is currently in medical school.  His younger brother had multivessel coronary bypass surgery about the same time that [name] received this stent.  Past Medical History:  Diagnosis Date  . CAD (coronary artery disease) 09/2013  . Heart disease   . HTN (hypertension)   . Migraines   . Trigeminal neuralgia of right side of face  Past Surgical History:  Procedure Laterality Date  . CORONARY STENT INTERVENTION N/A 09/14/2019   Procedure: CORONARY STENT INTERVENTION;  Surgeon: Kelly, Thomas A, MD;  [facility]: [facility];  Service: Cardiovascular;  Laterality: N/A;  . LEFT HEART CATH AND CORONARY ANGIOGRAPHY N/A  09/14/2019   Procedure: LEFT HEART CATH AND CORONARY ANGIOGRAPHY;  Surgeon: Kelly, Thomas A, MD;  Location: [facility] INV[facility] LAB;  Service: Cardiovascular;  Laterality: N/A;    Current Medications: Current Meds  Medication Sig  . aspirin EC 81 MG tablet Take 81 mg by mouth daily.  . baclofen (LIORESAL) 10 MG tablet Take 2 tablets (20 mg total) by mouth 2 (two) times daily.  . metoprolol succinate (TOPROL-XL) 25 MG 24 hr tablet Take 1 tablet (25 mg total) by mouth daily. Take with or immediately following a meal. (Patient taking differently: Take 25 mg by mouth at bedtime. )  . [DISCONTINUED] ascorbic acid (VITAMIN C) 500 MG tablet Take 500 mg by mouth 3 (three) times a week.   . [DISCONTINUED] atorvastatin (LIPITOR) 40 MG tablet TAKE ONE TABLET BY MOUTH DAILY  . [DISCONTINUED] cholecalciferol (VITAMIN D3) 25 MCG (1000 UNIT) tablet Take 1,000 Units by mouth 3 (three) times a week.   . [DISCONTINUED] lamoTRIgine (LAMICTAL) 100 MG tablet Take 2 tablets (200 mg total) by mouth 2 (two) times daily.     Allergies:   Patient has no known allergies.   Social History   Socioeconomic History  . Marital status: Married    Spouse name: Kevin Browning  . Number of children: 3  . Years of education: 12  . Highest education level: Not on file  Occupational History    Comment: NA  Tobacco Use  . Smoking status: Never Smoker  . Smokeless tobacco: Never Used  Substance and Sexual Activity  . Alcohol use: Never  . Drug use: Never  . Sexual activity: Not on file  Other Topics Concern  . Not on file  Social History Narrative   Lives with family   Caffeine- some   Social Determinants of Health   Financial Resource Strain:   . Difficulty of Paying Living Expenses: Not on file  Food Insecurity:   . Worried About Running Out of Food in the Last Year: Not on file  . Ran Out of Food in the Last Year: Not on file  Transportation Needs:   . Lack of Transportation (Medical): Not on file  . Lack of  Transportation (Non-Medical): Not on file  Physical Activity:   . Days of Exercise per Week: Not on file  . Minutes of Exercise per Session: Not on file  Stress:   . Feeling of Stress : Not on file  Social Connections:   . Frequency of Communication with Friends and Family: Not on file  . Frequency of Social Gatherings with Friends and Family: Not on file  . Attends Religious Services: Not on file  . Active Member of Clubs or Organizations: Not on file  . Attends Club or Organization Meetings: Not on file  . Marital Status: Not on file     Family History: The patient's family history includes CAD in his brother; Cancer - Colon in his father; Diabetes in his mother; Diabetes Mellitus II in his father; Heart failure in his maternal grandmother; Hypertension in his father and mother.  ROS:   Please see the history of present illness.    All other systems are reviewed and are negative.   EKGs/Labs/Other Studies Reviewed:      The following studies were reviewed today: Angiogram/PCI 09/14/2019  EKG:  EKG is ordered today and shows normal sinus rhythm with early repolarization.  No ischemic change.  Recent Labs: 09/10/2019: BUN 15; Creatinine, Ser 1.20; Hemoglobin 14.5; Platelets 186; Potassium 4.2; Sodium 143  Recent Lipid Panel    Component Value Date/Time   CHOL 159 09/01/2019 0830   TRIG 247 (H) 09/01/2019 0830   HDL 23 (L) 09/01/2019 0830   CHOLHDL 6.9 (H) 09/01/2019 0830   LDLCALC 94 09/01/2019 0830    Physical Exam:    VS:  BP 136/82 (BP Location: Right Arm, Patient Position: Sitting, Cuff Size: Normal)   Pulse 66   Ht 6' 1" (1.854 m)   Wt 199 lb (90.3 kg)   BMI 26.25 kg/m     Wt Readings from Last 3 Encounters:  06/21/20 199 lb (90.3 kg)  03/21/20 187 lb 9.6 oz (85.1 kg)  01/11/20 189 lb (85.7 kg)       General: Alert, oriented x3, no distress, mildly overweight Head: Stitches, mild erythema lateral right mastoid area, PERRL, EOMI, no exophtalmos or lid lag,  no myxedema, no xanthelasma; normal ears, nose and oropharynx Neck: normal jugular venous pulsations and no hepatojugular reflux; brisk carotid pulses without delay and no carotid bruits Chest: clear to auscultation, no signs of consolidation by percussion or palpation, normal fremitus, symmetrical and full respiratory excursions Cardiovascular: normal position and quality of the apical impulse, regular rhythm, normal first and second heart sounds, no murmurs, rubs or gallops Abdomen: no tenderness or distention, no masses by palpation, no abnormal pulsatility or arterial bruits, normal bowel sounds, no hepatosplenomegaly Extremities: no clubbing, cyanosis or edema; 2+ radial, ulnar and brachial pulses bilaterally; 2+ right femoral, posterior tibial and dorsalis pedis pulses; 2+ left femoral, posterior tibial and dorsalis pedis pulses; no subclavian or femoral bruits.  PICC line left upper extremity. Neurological: grossly nonfocal Psych: Normal mood and affect    ASSESSMENT:    1. Coronary artery disease of native artery of native heart with stable angina pectoris (HCC)   2. Essential hypertension   3. Dyslipidemia   4. Other acute osteomyelitis, other site (HCC)    PLAN:    In order of problems listed above:  1. CAD: Currently asymptomatic on beta-blocker monotherapy.  He does have a chronically occluded diagonal artery that is the cause problems with angina when he is more active.  On aspirin, no longer on dual antiplatelet therapy.  His major risk factor remains the adverse metabolic profile, but he was unable to tolerate atorvastatin. 2. HTN: Start Bystolic 10 mg once daily. 3. HLP: Target LDL cholesterol less than 70, critical to avoid future coronary events.  Most recent lipid profile showed an LDL cholesterol of 94 and a very low HDL cholesterol of 23.  Triglycerides also mildly elevated.  Recommend trying rosuvastatin.  If he cannot tolerate this medication either, he should receive  PCSK9 inhibitors. 4. Postoperative osteomyelitis: Has completed roughly 4 weeks of the planned 6 weeks of antibiotic therapy.  Recently met with his infectious disease specialist at [facility] and they decided that he may be able to do the last week of antibiotics with oral therapy. 5. History of gout: No recent episodes.  Medication Adjustments/Labs and Tests Ordered: Current medicines are reviewed at length with the patient today.  Concerns regarding medicines are outlined above.  Orders Placed This Encounter  Procedures  . Lipid panel  . EKG 12-Lead   Meds ordered this encounter    Medications  . rosuvastatin (CRESTOR) 20 MG tablet    Sig: Take 1 tablet (20 mg total) by mouth daily.    Dispense:  90 tablet    Refill:  3    Patient Instructions  Medication Instructions:  START Rosuvastatin 20 mg once daily  *If you need a refill on your cardiac medications before your next appointment, please call your pharmacy*   Lab Work: Your provider would like for you to return in 3 months to have the following labs drawn: Fasting Lipid. You do not need an appointment for the lab. Once in our office lobby there is a podium where you can sign in and ring the doorbell to alert us that you are here. The lab is open from 8:00 am to 4:30 pm; closed for lunch from 12:45pm-1:45pm.  If you have labs (blood work) drawn today and your tests are completely normal, you will receive your results only by: . MyChart Message (if you have MyChart) OR . A paper copy in the mail If you have any lab test that is abnormal or we need to change your treatment, we will call you to review the results.   Testing/Procedures: None ordered   Follow-Up: At [facility], you and your health needs are our priority.  As part of our continuing mission to provide you with exceptional heart care, we have created designated Provider Care Teams.  These Care Teams include your primary Cardiologist (physician) and  Advanced Practice Providers (APPs -  Physician Assistants and Nurse Practitioners) who all work together to provide you with the care you need, when you need it.  We recommend signing up for the patient portal called "MyChart".  Sign up information is provided on this After Visit Summary.  MyChart is used to connect with patients for Virtual Visits (Telemedicine).  Patients are able to view lab/test results, encounter notes, upcoming appointments, etc.  Non-urgent messages can be sent to your provider as well.   To learn more about what you can do with MyChart, go to [email or web address].    Your next appointment:   6 month(s)  The format for your next appointment:   In Person  Provider:   Mihai Croitoru, MD      Signed, Mihai Croitoru, MD  06/21/2020 6:12 PM    [facility]  

## 2020-06-21 NOTE — Patient Instructions (Signed)
Medication Instructions:  START Rosuvastatin 20 mg once daily  *If you need a refill on your cardiac medications before your next appointment, please call your pharmacy*   Lab Work: Your provider would like for you to return in 3 months to have the following labs drawn: Fasting Lipid. You do not need an appointment for the lab. Once in our office lobby there is a podium where you can sign in and ring the doorbell to alert [place] that you are here. The lab is open from 8:00 am to 4:30 pm; closed for lunch from 12:45pm-1:45pm.  If you have labs (blood work) drawn today and your tests are completely normal, you will receive your results only by: Marland Kitchen MyChart Message (if you have MyChart) OR . A paper copy in the mail If you have any lab test that is abnormal or we need to change your treatment, we will call you to review the results.   Testing/Procedures: None ordered   Follow-Up: At [facility], you and your health needs are our priority.  As part of our continuing mission to provide you with exceptional heart care, we have created designated Provider Care Teams.  These Care Teams include your primary Cardiologist (physician) and Advanced Practice Providers (APPs -  Physician Assistants and Nurse Practitioners) who all work together to provide you with the care you need, when you need it.  We recommend signing up for the patient portal called "MyChart".  Sign up information is provided on this After Visit Summary.  MyChart is used to connect with patients for Virtual Visits (Telemedicine).  Patients are able to view lab/test results, encounter notes, upcoming appointments, etc.  Non-urgent messages can be sent to your provider as well.   To learn more about what you can do with MyChart, go to [email or web address].    Your next appointment:   6 month(s)  The format for your next appointment:   In Person  Provider:   Thurmon Fair, MD

## 2020-06-26 ENCOUNTER — Other Ambulatory Visit: Payer: Self-pay

## 2020-06-26 ENCOUNTER — Ambulatory Visit (INDEPENDENT_AMBULATORY_CARE_PROVIDER_SITE_OTHER): Payer: BC Managed Care – PPO | Admitting: Diagnostic Neuroimaging

## 2020-06-26 ENCOUNTER — Encounter: Payer: Self-pay | Admitting: Diagnostic Neuroimaging

## 2020-06-26 VITALS — BP 130/77 | HR 83 | Ht 73.0 in | Wt 200.0 lb

## 2020-06-26 DIAGNOSIS — G500 Trigeminal neuralgia: Secondary | ICD-10-CM

## 2020-06-26 NOTE — Progress Notes (Signed)
[facility]  PATIENT: Kevin Browning DOB: [birth year]  REFERRING CLINICIAN: No ref. provider found HISTORY FROM: patient  REASON FOR VISIT: new consult    HISTORICAL  CHIEF COMPLAINT:  Chief Complaint  Patient presents with  . Trigeminall neuralgia    Rm 6, 6 month FU    HISTORY OF PRESENT ILLNESS:   UPDATE (06/26/20, VRP): Since last visit, had right MVD surgery (04/26/20), with great results of pain improvement. Had wound complication / infx, and needed 2nd surgery on 05/30/20. Now on IV abx via PICC. Overall better.   PRIOR HPI 54 year old male here for evaluation of trigeminal neuralgia.  Around 2010 patient was diagnosed with migraine headaches with intense right sided maxillary pain pressure nausea seeing black dots.  He was tried on Imitrex with some relief.  Headaches were intermittent.  Around 2015 patient had a change in his headaches.  Migraines stopped happening but instead he started to have lancinating electrical shocklike sensations through his right mandibular and maxillary region.  Symptoms could be triggered by light touch, cold air, cold weather, eating or drinking.  Intense pain could last for 5 to 10 minutes at a time continuously and then intermittently with shocklike sensations.  Symptoms would be cyclical affecting him from November until April every year.  He was tried on carbamazepine but this caused rash.  He was switched to gabapentin and oxcarbazepine which seemed to help.  He would take this medicine for 6 months or so at a time and symptoms would resolve.  Then patient will take a break from medication until pain would return, and then he would restart medications.  Patient has moved to a variety of locations and been treated by various doctors over the past 5 years.  These include doctors in [place], [place] in [place].  Since this past November symptoms have worsened.  Since March or April symptoms have been particularly  intense.  Unfortunately gabapentin and oxcarbazepine are not working anymore.  He stopped oxcarbazepine due to development of some rash and also ineffectiveness of medication.  He was started on lamotrigine but this also has not helped.   REVIEW OF SYSTEMS: Full 14 system review of systems performed and negative with exception of: As per HPI.  ALLERGIES: No Known Allergies  HOME MEDICATIONS: Outpatient Medications Prior to Visit  Medication Sig Dispense Refill  . aspirin EC 81 MG tablet Take 81 mg by mouth daily.    . baclofen (LIORESAL) 10 MG tablet Take 2 tablets (20 mg total) by mouth 2 (two) times daily. 120 each 6  . metoprolol succinate (TOPROL-XL) 25 MG 24 hr tablet Take 1 tablet (25 mg total) by mouth daily. Take with or immediately following a meal. (Patient taking differently: Take 25 mg by mouth at bedtime.) 30 tablet 3  . rosuvastatin (CRESTOR) 20 MG tablet Take 1 tablet (20 mg total) by mouth daily. 90 tablet 3  . UNABLE TO FIND Med Name: cefepime, vancomycin per PICC line until 07/01/20    . ciprofloxacin (CIPRO) 750 MG tablet Take 750 mg by mouth 2 (two) times daily. (Patient not taking: Reported on 06/26/2020)    . linezolid (ZYVOX) 600 MG tablet Take 600 mg by mouth 2 (two) times daily. 06/26/20 will begin 07/01/20 (Patient not taking: Reported on 06/26/2020)     No facility-administered medications prior to visit.    PAST MEDICAL HISTORY: Past Medical History:  Diagnosis Date  . CAD (coronary artery disease) 09/2013  . Heart disease   . HTN (  hypertension)   . Migraines   . Trigeminal neuralgia of right side of face     PAST SURGICAL HISTORY: Past Surgical History:  Procedure Laterality Date  . CORONARY STENT INTERVENTION N/A 09/14/2019   Procedure: CORONARY STENT INTERVENTION;  Surgeon: Lennette Bihari, MD;  Location: [facility] INVASIVE CV LAB;  Service: Cardiovascular;  Laterality: N/A;  . CRANIOTOMY  04/26/2020   MVD  . LEFT HEART CATH AND CORONARY ANGIOGRAPHY N/A  09/14/2019   Procedure: LEFT HEART CATH AND CORONARY ANGIOGRAPHY;  Surgeon: Lennette Bihari, MD;  Location: [facility] INVASIVE CV LAB;  Service: Cardiovascular;  Laterality: N/A;    FAMILY HISTORY: Family History  Problem Relation Age of Onset  . Diabetes Mother        type 2  . Hypertension Mother   . Cancer - Colon Father   . Diabetes Mellitus II Father   . Hypertension Father   . CAD Brother        bypass x4  . Heart failure Maternal Grandmother     SOCIAL HISTORY: Social History   Socioeconomic History  . Marital status: Married    Spouse name: Kevin Browning  . Number of children: 3  . Years of education: 63  . Highest education level: Not on file  Occupational History    Comment: NA  Tobacco Use  . Smoking status: Never Smoker  . Smokeless tobacco: Never Used  Substance and Sexual Activity  . Alcohol use: Never  . Drug use: Never  . Sexual activity: Not on file  Other Topics Concern  . Not on file  Social History Narrative   Lives with family   Caffeine- some   Social Determinants of Health   Financial Resource Strain: Not on file  Food Insecurity: Not on file  Transportation Needs: Not on file  Physical Activity: Not on file  Stress: Not on file  Social Connections: Not on file  Intimate Partner Violence: Not on file     PHYSICAL EXAM  GENERAL EXAM/CONSTITUTIONAL: Vitals:  Vitals:   06/26/20 1408  BP: 130/77  Pulse: 83  Weight: 200 lb (90.7 kg)  Height: 6\' 1"  (1.854 m)   Body mass index is 26.39 kg/m. Wt Readings from Last 3 Encounters:  06/26/20 200 lb (90.7 kg)  06/21/20 199 lb (90.3 kg)  03/21/20 187 lb 9.6 oz (85.1 kg)    Patient is in no distress; well developed, nourished and groomed; neck is supple  CARDIOVASCULAR:  Examination of carotid arteries is normal; no carotid bruits  Regular rate and rhythm, no murmurs  Examination of peripheral vascular system by observation and palpation is normal  EYES:  Ophthalmoscopic exam of optic  discs and posterior segments is normal; no papilledema or hemorrhages No exam data present  MUSCULOSKELETAL:  Gait, strength, tone, movements noted in Neurologic exam below  NEUROLOGIC: MENTAL STATUS:  No flowsheet data found.  awake, alert, oriented to person, place and time  recent and remote memory intact  normal attention and concentration  language fluent, comprehension intact, naming intact  fund of knowledge appropriate  CRANIAL NERVE:   2nd - no papilledema on fundoscopic exam  2nd, 3rd, 4th, 6th - pupils equal and reactive to light, visual fields full to confrontation, extraocular muscles intact, no nystagmus  5th - facial sensation symmetric --> VERY SENSITIVE TO TOUCH IN RIGHT V2, V3  7th - facial strength symmetric  8th - hearing intact  9th - palate elevates symmetrically, uvula midline  11th - shoulder shrug symmetric  12th - tongue protrusion midline  MOTOR:   normal bulk and tone, full strength in the BUE, BLE  SENSORY:   normal and symmetric to light touch, temperature, vibration  COORDINATION:   finger-nose-finger, fine finger movements normal  REFLEXES:   deep tendon reflexes present and symmetric  GAIT/STATION:   narrow based gait     DIAGNOSTIC DATA (LABS, IMAGING, TESTING) - I reviewed patient records, labs, notes, testing and imaging myself where available.  Lab Results  Component Value Date   WBC 4.2 09/10/2019   HGB 14.5 09/10/2019   HCT 42.7 09/10/2019   MCV 85 09/10/2019   PLT 186 09/10/2019      Component Value Date/Time   NA 143 09/10/2019 1237   K 4.2 09/10/2019 1237   CL 108 (H) 09/10/2019 1237   CO2 20 09/10/2019 1237   GLUCOSE 95 09/10/2019 1237   BUN 15 09/10/2019 1237   CREATININE 1.20 09/10/2019 1237   CALCIUM 9.2 09/10/2019 1237   GFRNONAA 68 09/10/2019 1237   GFRAA 79 09/10/2019 1237   Lab Results  Component Value Date   CHOL 159 09/01/2019   HDL 23 (L) 09/01/2019   LDLCALC 94 09/01/2019    TRIG 247 (H) 09/01/2019   CHOLHDL 6.9 (H) 09/01/2019   No results found for: HGBA1C No results found for: VITAMINB12 No results found for: TSH     ASSESSMENT AND PLAN  54 y.o. year old male here with:  Dx:  1. Right trigeminal neuralgia      PLAN:  RIGHT FACIAL PAIN / TRIGEMINAL NEURALGIA (intractable; tried gabapentin, OXC, CBZ) - status / post microvascular decompression (with improvement in pain); now post-op wound complication; on abx  Return for pending if symptoms worsen or fail to improve.    Suanne Marker, MD 06/26/2020, 2:21 PM Certified in Neurology, Neurophysiology and Neuroimaging  [facility] [address] [place], [place] [address] [phone]

## 2020-06-26 NOTE — Patient Instructions (Signed)
followup as needed

## 2020-10-23 ENCOUNTER — Other Ambulatory Visit: Payer: Self-pay

## 2020-10-23 ENCOUNTER — Encounter: Payer: Self-pay | Admitting: Cardiovascular Disease

## 2020-10-23 ENCOUNTER — Ambulatory Visit (INDEPENDENT_AMBULATORY_CARE_PROVIDER_SITE_OTHER): Payer: BC Managed Care – PPO | Admitting: Cardiovascular Disease

## 2020-10-23 VITALS — BP 124/76 | HR 59 | Ht 72.0 in | Wt 204.0 lb

## 2020-10-23 DIAGNOSIS — E785 Hyperlipidemia, unspecified: Secondary | ICD-10-CM | POA: Diagnosis not present

## 2020-10-23 DIAGNOSIS — R0609 Other forms of dyspnea: Secondary | ICD-10-CM

## 2020-10-23 DIAGNOSIS — Z8739 Personal history of other diseases of the musculoskeletal system and connective tissue: Secondary | ICD-10-CM

## 2020-10-23 DIAGNOSIS — R0600 Dyspnea, unspecified: Secondary | ICD-10-CM

## 2020-10-23 DIAGNOSIS — I10 Essential (primary) hypertension: Secondary | ICD-10-CM

## 2020-10-23 DIAGNOSIS — R079 Chest pain, unspecified: Secondary | ICD-10-CM

## 2020-10-23 DIAGNOSIS — I25118 Atherosclerotic heart disease of native coronary artery with other forms of angina pectoris: Secondary | ICD-10-CM | POA: Diagnosis not present

## 2020-10-23 MED ORDER — ISOSORBIDE MONONITRATE ER 30 MG PO TB24: 30.0000 mg | ORAL_TABLET | Freq: Every day | ORAL | 3 refills | Status: DC

## 2020-10-23 MED ORDER — NITROGLYCERIN 0.4 MG SL SUBL: 0.4000 mg | SUBLINGUAL_TABLET | SUBLINGUAL | 1 refills | Status: AC | PRN

## 2020-10-23 NOTE — Patient Instructions (Signed)
Medication Instructions:  Take Nitroglycerin as needed for chest pain: Place one tablet under the tongue as needed every 5 minutes for chest pain. If you have to use three tablets with no relief, please call 911 or go to your closet Emergency Department.  START Imdur 30 mg once daily  *If you need a refill on your cardiac medications before your next appointment, please call your pharmacy*   Lab Work: None ordered If you have labs (blood work) drawn today and your tests are completely normal, you will receive your results only by: Marland Kitchen MyChart Message (if you have MyChart) OR . A paper copy in the mail If you have any lab test that is abnormal or we need to change your treatment, we will call you to review the results.   Testing/Procedures: None ordered   Follow-Up: At [facility], you and your health needs are our priority.  As part of our continuing mission to provide you with exceptional heart care, we have created designated Provider Care Teams.  These Care Teams include your primary Cardiologist (physician) and Advanced Practice Providers (APPs -  Physician Assistants and Nurse Practitioners) who all work together to provide you with the care you need, when you need it.  We recommend signing up for the patient portal called "MyChart".  Sign up information is provided on this After Visit Summary.  MyChart is used to connect with patients for Virtual Visits (Telemedicine).  Patients are able to view lab/test results, encounter notes, upcoming appointments, etc.  Non-urgent messages can be sent to your provider as well.   To learn more about what you can do with MyChart, go to [email or web address].    Your next appointment:   3 month(s)  The format for your next appointment:   In Person  Provider:   You may see Thurmon Fair, MD or one of the following Advanced Practice Providers on your designated Care Team:    Azalee Course, PA-C  Micah Flesher, PA-C or   Judy Pimple,  [place]

## 2020-10-23 NOTE — Progress Notes (Signed)
Cardiology Office Note:    Date:  10/28/2020   ID:  Kevin Browning, DOB Mar 14, 1966, MRN [id number]  PCP:  Patient, No Pcp Per (Inactive)  Cardiologist:  Thurmon Fair, MD  Electrophysiologist:  None   Referring MD: No ref. provider found   Chief Complaint  Patient presents with  . Follow-up  CAD  History of Present Illness:    Kevin Browning is a 55 y.o. male with a hx of low HDL dyslipidemia and coronary artery disease, initially presenting with exertional angina in March 2015 ( 95% LAD stenosis, treated with placement of a drug-eluting Promus Premier [id number] stent in the proximal LAD artery in [place]), returning with exertional angina in February 2021.  Nuclear stress testing showed 2 areas of ischemia in the inferolateral and anterolateral walls, corresponding to the left circumflex and diagonal arteries respectively.  Cardiac catheterization on 09/14/2019 showed total occlusion of a large diagonal artery filling via collaterals and a 95% stenosis in the proximal left circumflex coronary artery, treated with a 2.5 x 8 mm Resolute DES.  He underwent trigeminal neuralgia decompression surgery last fall, complicated by infection requiring protracted course of IV antibiotics, but has recovered well from this without any residual symptoms.  His father-in-law passed away after a complicated course with Covid pneumonia in January.  Around that time he had increasing frequency of angina pectoris.  However things seem to have stabilized since in the last 3 months or so.  He still has some exertional complaints.  He is able to perform yard work, but does develop shortness of breath when he takes the trash out.  He only develops angina with intense physical activity.  He is compliant with aspirin antiplatelet therapy and no longer takes Brilinta.  He is on a very low dose of metoprolol and an effective dose of a potent statin.  He gained some weight during his recovery from trigeminal neuralgia and is  still trying to shake it off, but is sure that with the spring and summer season and increased activity outside he will be better.  He did not tolerate atorvastatin, but seems to be okay on rosuvastatin.  We have not rechecked his lipid profile.  He has a chronically very low HDL.  His baseline untreated cholesterol was actually not bad (total cholesterol 159, LDL 94), but he has a severely low HDL cholesterol 23 and triglycerides of 247, consistent with insulin resistance/metabolic syndrome X.  Fasting glucose was 95.  He is mildly overweight with a BMI of about 27-28.  His angina was represented by discomfort in his left jaw and left arm as well as the retrosternal area: He motions to the center of his chest with a clawlike movement and describes the sensation as an elephant standing on his chest.  The nuclear stress test also showed ischemia in the diagonal territory and an ejection fraction of 46%.  At the time of his cardiac catheterization the diagonal artery was found to be totally occluded with good collaterals.  Left ventricular systolic function is normal with an ejection fraction of 55%, but the LVEDP was slightly elevated at 20-23.  His wife Kevin Browning is a Scientist, research (physical sciences).  They are originally from [place].  Their oldest son Max is currently in medical school.  His younger brother had multivessel coronary bypass surgery about the same time that [name] received this stent.  Past Medical History:  Diagnosis Date  . CAD (coronary artery disease) 09/2013  . Heart disease   . HTN (hypertension)   .  Migraines   . Trigeminal neuralgia of right side of face     Past Surgical History:  Procedure Laterality Date  . CORONARY STENT INTERVENTION N/A 09/14/2019   Procedure: CORONARY STENT INTERVENTION;  Surgeon: Lennette Bihari, MD;  Location: [facility] IN[facility]AS[facility] CV LAB;  Service: Cardiovascular;  Laterality: N/A;  . CRANIOTOMY  04/26/2020   MVD  . LEFT HEART CATH AND CORONARY ANGIOGRAPHY  N/A 09/14/2019   Procedure: LEFT HEART CATH AND CORONARY ANGIOGRAPHY;  Surgeon: Lennette Bihari, MD;  Location: [facility] INVASIVE CV LAB;  Service: Cardiovascular;  Laterality: N/A;    Current Medications: Current Meds  Medication Sig  . isosorbide mononitrate (IMDUR) 30 MG 24 hr tablet Take 1 tablet (30 mg total) by mouth daily.  . nitroGLYCERIN (NITROSTAT) 0.4 MG SL tablet Place 1 tablet (0.4 mg total) under the tongue every 5 (five) minutes as needed for chest pain.     Allergies:   Patient has no known allergies.   Social History   Socioeconomic History  . Marital status: Married    Spouse name: Kevin Browning  . Number of children: 3  . Years of education: 35  . Highest education level: Not on file  Occupational History    Comment: NA  Tobacco Use  . Smoking status: Never Smoker  . Smokeless tobacco: Never Used  Substance and Sexual Activity  . Alcohol use: Never  . Drug use: Never  . Sexual activity: Not on file  Other Topics Concern  . Not on file  Social History Narrative   Lives with family   Caffeine- some   Social Determinants of Health   Financial Resource Strain: Not on file  Food Insecurity: Not on file  Transportation Needs: Not on file  Physical Activity: Not on file  Stress: Not on file  Social Connections: Not on file     Family History: The patient's family history includes CAD in his brother; Cancer - Colon in his father; Diabetes in his mother; Diabetes Mellitus II in his father; Heart failure in his maternal grandmother; Hypertension in his father and mother.  ROS:   Please see the history of present illness.    All other systems are reviewed and are negative.   EKGs/Labs/Other Studies Reviewed:    The following studies were reviewed today: Angiogram/PCI 09/14/2019  EKG:  EKG is ordered today and shows normal sinus rhythm, normal tracing.  (His ECG at rest never really revealed any evidence of ischemia).  Recent Labs: No results found for requested  labs within last 8760 hours.  Recent Lipid Panel    Component Value Date/Time   CHOL 159 09/01/2019 0830   TRIG 247 (H) 09/01/2019 0830   HDL 23 (L) 09/01/2019 0830   CHOLHDL 6.9 (H) 09/01/2019 0830   LDLCALC 94 09/01/2019 0830    Physical Exam:    VS:  BP 124/76   Pulse (!) 59   Ht 6' (1.829 m)   Wt 204 lb (92.5 kg)   SpO2 98%   BMI 27.67 kg/m     Wt Readings from Last 3 Encounters:  10/23/20 204 lb (92.5 kg)  06/26/20 200 lb (90.7 kg)  06/21/20 199 lb (90.3 kg)      General: Alert, oriented x3, no distress, overweight Head: no evidence of trauma, PERRL, EOMI, no exophtalmos or lid lag, no myxedema, no xanthelasma; normal ears, nose and oropharynx Neck: normal jugular venous pulsations and no hepatojugular reflux; brisk carotid pulses without delay and no carotid bruits Chest: clear to  auscultation, no signs of consolidation by percussion or palpation, normal fremitus, symmetrical and full respiratory excursions Cardiovascular: normal position and quality of the apical impulse, regular rhythm, normal first and second heart sounds, no murmurs, rubs or gallops Abdomen: no tenderness or distention, no masses by palpation, no abnormal pulsatility or arterial bruits, normal bowel sounds, no hepatosplenomegaly Extremities: no clubbing, cyanosis or edema; 2+ radial, ulnar and brachial pulses bilaterally; 2+ right femoral, posterior tibial and dorsalis pedis pulses; 2+ left femoral, posterior tibial and dorsalis pedis pulses; no subclavian or femoral bruits Neurological: grossly nonfocal Psych: Normal mood and affect   ASSESSMENT:    1. Coronary artery disease of native artery of native heart with stable angina pectoris (HCC)   2. Dyslipidemia   3. Essential hypertension   4. History of gout   5. Exertional dyspnea    PLAN:    In order of problems listed above:  1. CAD: CCS functional class II exertional angina.  This could be due to the chronically occluded large diagonal  artery.  Add long-acting nitrates to his beta-blocker.  Continue aspirin and high-dose statin.  Also gave him a prescription for short acting nitroglycerin and instructed him in its safe and proper use.  For severe chest discomfort lasting than 3 minutes not relieved by 3 sublingual nitroglycerin tablets taken consecutively every 5 minutes, he should seek emergency medical attention.   2. HTN: Controlled with a very low-dose of beta-blocker. 3. HLP: Target LDL cholesterol less than 70, critical to avoid future coronary events.  Advised weight loss and exercise to improve the HDL.  Critical that he remain on effective lipid-lowering therapy.  If he does not tolerate statin, can switch to Repatha. 4. History of gout: No recent episodes.  Discussed purine metabolism, foods that are likely to increase production of uric acid, the need to stay well-hydrated. 5. Exertional dyspnea: At this point, it is unclear whether this is not simply deconditioning from his prolonged periods of inability to exert himself following his trigeminal neuralgia surgery and osteomyelitis, with subsequent weight gain.  If symptoms worsen we may simply have to commit to cardiac catheterization since he is still in the window for possible in-stent restenosis.  Medication Adjustments/Labs and Tests Ordered: Current medicines are reviewed at length with the patient today.  Concerns regarding medicines are outlined above.  Orders Placed This Encounter  Procedures  . EKG 12-Lead   Meds ordered this encounter  Medications  . nitroGLYCERIN (NITROSTAT) 0.4 MG SL tablet    Sig: Place 1 tablet (0.4 mg total) under the tongue every 5 (five) minutes as needed for chest pain.    Dispense:  25 tablet    Refill:  1  . isosorbide mononitrate (IMDUR) 30 MG 24 hr tablet    Sig: Take 1 tablet (30 mg total) by mouth daily.    Dispense:  90 tablet    Refill:  3    Patient Instructions  Medication Instructions:  Take Nitroglycerin as needed  for chest pain: Place one tablet under the tongue as needed every 5 minutes for chest pain. If you have to use three tablets with no relief, please call 911 or go to your closet Emergency Department.  START Imdur 30 mg once daily  *If you need a refill on your cardiac medications before your next appointment, please call your pharmacy*   Lab Work: None ordered If you have labs (blood work) drawn today and your tests are completely normal, you will receive your results only  by: . MyChart Message (if you have MyChart) OR . A paper copy in the mail If you have any lab test that is abnormal or we need to change your treatment, we will call you to review the results.   Testing/Procedures: None ordered   Follow-Up: [facility], you and your health needs are our priority.  As part of our continuing mission to provide you with exceptional heart care, we have created designated Provider Care Teams.  These Care Teams include your primary Cardiologist (physician) and Advanced Practice Providers (APPs -  Physician Assistants and Nurse Practitioners) who all work together to provide you with the care you need, when you need it.  We recommend signing up for the patient portal called "MyChart".  Sign up information is provided on this After Visit Summary.  MyChart is used to connect with patients for Virtual Visits (Telemedicine).  Patients are able to view lab/test results, encounter notes, upcoming appointments, etc.  Non-urgent messages can be sent to your provider as well.   To learn more about what you can do with MyChart, go to [email or web address].    Your next appointment:   3 month(s)  The format for your next appointment:   In Person  Provider:   You may see Thurmon FairMihai Caprisha Bridgett, MD or one of the following Advanced Practice Providers on your designated Care Team:    Azalee CourseHao Meng, PA-C  Micah FlesherAngela Duke, [place]PA-C or   Judy PimpleKrista Kroeger, PA-C      Signed, Thurmon FairMihai Clarke Peretz, MD  10/28/2020 5:08  PM    [facility]

## 2020-10-28 ENCOUNTER — Encounter: Payer: Self-pay | Admitting: Cardiovascular Disease

## 2020-11-13 ENCOUNTER — Encounter: Payer: Self-pay | Admitting: *Deleted

## 2021-01-08 ENCOUNTER — Encounter (HOSPITAL_COMMUNITY): Payer: Self-pay

## 2021-01-08 ENCOUNTER — Emergency Department (HOSPITAL_COMMUNITY): Payer: BC Managed Care – PPO

## 2021-01-08 ENCOUNTER — Emergency Department (HOSPITAL_COMMUNITY)
Admission: EM | Admit: 2021-01-08 | Discharge: 2021-01-08 | Disposition: A | Discharge status: Home / Self Care | Payer: BC Managed Care – PPO | Attending: Emergency Medicine | Admitting: Emergency Medicine

## 2021-01-08 DIAGNOSIS — Z7982 Long term (current) use of aspirin: Secondary | ICD-10-CM | POA: Insufficient documentation

## 2021-01-08 DIAGNOSIS — Z79899 Other long term (current) drug therapy: Secondary | ICD-10-CM | POA: Insufficient documentation

## 2021-01-08 DIAGNOSIS — N200 Calculus of kidney: Secondary | ICD-10-CM

## 2021-01-08 DIAGNOSIS — N23 Unspecified renal colic: Secondary | ICD-10-CM | POA: Diagnosis not present

## 2021-01-08 DIAGNOSIS — I2510 Atherosclerotic heart disease of native coronary artery without angina pectoris: Secondary | ICD-10-CM | POA: Diagnosis not present

## 2021-01-08 DIAGNOSIS — I10 Essential (primary) hypertension: Secondary | ICD-10-CM | POA: Diagnosis not present

## 2021-01-08 DIAGNOSIS — R109 Unspecified abdominal pain: Secondary | ICD-10-CM | POA: Diagnosis present

## 2021-01-08 LAB — BASIC METABOLIC PANEL
Anion gap: 7 (ref 5–15)
BUN: 24 mg/dL — ABNORMAL HIGH (ref 6–20)
CO2: 22 mmol/L (ref 22–32)
Calcium: 9.5 mg/dL (ref 8.9–10.3)
Chloride: 109 mmol/L (ref 98–111)
Creatinine, Ser: 1.85 mg/dL — ABNORMAL HIGH (ref 0.61–1.24)
GFR, Estimated: 42 mL/min — ABNORMAL LOW (ref >60)
Glucose, Bld: 125 mg/dL — ABNORMAL HIGH (ref 70–99)
Potassium: 4.5 mmol/L (ref 3.5–5.1)
Sodium: 138 mmol/L (ref 135–145)

## 2021-01-08 LAB — CBC WITH DIFFERENTIAL/PLATELET
Abs Immature Granulocytes: 0.02 10*3/uL (ref 0.00–0.07)
Basophils Absolute: 0 10*3/uL (ref 0.0–0.1)
Basophils Relative: 0 %
Eosinophils Absolute: 0.1 10*3/uL (ref 0.0–0.5)
Eosinophils Relative: 2 %
HCT: 43 % (ref 39.0–52.0)
Hemoglobin: 14.5 g/dL (ref 13.0–17.0)
Immature Granulocytes: 0 %
Lymphocytes Relative: 12 %
Lymphs Abs: 0.7 10*3/uL (ref 0.7–4.0)
MCH: 28.9 pg (ref 26.0–34.0)
MCHC: 33.7 g/dL (ref 30.0–36.0)
MCV: 85.8 fL (ref 80.0–100.0)
Monocytes Absolute: 0.6 10*3/uL (ref 0.1–1.0)
Monocytes Relative: 10 %
Neutro Abs: 4.4 10*3/uL (ref 1.7–7.7)
Neutrophils Relative %: 76 %
Platelets: 193 10*3/uL (ref 150–400)
RBC: 5.01 MIL/uL (ref 4.22–5.81)
RDW: 13.1 % (ref 11.5–15.5)
WBC: 5.7 10*3/uL (ref 4.0–10.5)
nRBC: 0 % (ref 0.0–0.2)

## 2021-01-08 LAB — URINALYSIS, ROUTINE W REFLEX MICROSCOPIC
Bilirubin Urine: NEGATIVE
Glucose, UA: NEGATIVE mg/dL
Hgb urine dipstick: NEGATIVE
Ketones, ur: NEGATIVE mg/dL
Leukocytes,Ua: NEGATIVE
Nitrite: NEGATIVE
Protein, ur: NEGATIVE mg/dL
Specific Gravity, Urine: 1.021 (ref 1.005–1.030)
pH: 5 (ref 5.0–8.0)

## 2021-01-08 MED ORDER — IBUPROFEN 800 MG PO TABS
800.0000 mg | ORAL_TABLET | Freq: Once | ORAL | Status: AC
  Administered 2021-01-08: 800 mg via ORAL
  Filled 2021-01-08: qty 1

## 2021-01-08 MED ORDER — OXYCODONE HCL 5 MG PO TABS: 5.0000 mg | ORAL_TABLET | Freq: Four times a day (QID) | ORAL | 0 refills | Status: DC | PRN

## 2021-01-08 NOTE — ED Provider Notes (Signed)
Emergency Medicine Provider Triage Evaluation Note  Kevin Browning , a 55 y.o. male  was evaluated in triage.  Pt complains of left-sided flank pain x3 days that radiates down into his left groin, no pain in the penis or scrotum itself.  No dysuria, hematuria, decrease in urine, no history of kidney stones.  No fevers or chills but some nausea with 2 episodes of NBNB emesis when pain was most severe.  Pain only managed with residual Percocet prescription from recent surgery, however patient has run out and presents because PCP recommended evaluation for kidney stones.  He is not anticoagulated.  Patient was constipated, however resolved after laxatives, with normal bowel movement this morning.  Review of Systems  Positive: Left-sided flank pain that radiates to the groin, nausea, vomiting Negative: Fevers, chills, dysuria, hematuria, decreased urine  Physical Exam  BP 139/88 (BP Location: Right Arm)   Pulse 95   Temp 98.5 F (36.9 C) (Oral)   Resp 16   SpO2 100%  Gen:   Awake, no distress   Resp:  Normal effort  MSK:   Moves extremities without difficulty  Other:  No CVA tenderness bilaterally, some tenderness palpation in the left lower quadrant, witnessed episode of shooting pain on the left side where patient was most uncomfortable for approximately 30 seconds before it resolved on its own.  Medical Decision Making  Medically screening exam initiated at 1:34 PM.  Appropriate orders placed.  Harrel Ferrone was informed that the remainder of the evaluation will be completed by another provider, this initial triage assessment does not replace that evaluation, and the importance of remaining in the ED until their evaluation is complete.  This chart was dictated using voice recognition software, Dragon. Despite the best efforts of this provider to proofread and correct errors, errors may still occur which can change documentation meaning.  Analgesia offered, patient declined.   Sherrilee Gilles 01/08/21 1335    Terald Sleeper, MD 01/08/21 Mikle Bosworth

## 2021-01-08 NOTE — ED Triage Notes (Signed)
Patient reports intermittent left flank pain that radiates into the left groin x 3 days. Patient states he had some Percocet left over from a head injury and states he has ran out.

## 2021-01-08 NOTE — Discharge Instructions (Addendum)
Please do not take more than 800 mg of ibuprofen every 8 hours at home.  Try to take it with food.  You have significant pain you can take 1 to 2 tablets of oxycodone every 6 hours.  Please keep drinking plenty of water.  You have a small 1 mm kidney stone passing down the left side towards your bladder.

## 2021-01-08 NOTE — ED Provider Notes (Signed)
[facility]-EMERGENCY DEPT Provider Note   CSN: [id number] Arrival date & time: 01/08/21  1301     History Chief Complaint  Patient presents with   Flank Pain    Kevin Browning is a 55 y.o. male here with left flank pain, acute onset a few days ago.  Radiates toward his groin and testicle.  It's moving downwards from the flank.  Comes and goes.  Better with motrin and percocet at home.  Currently moderate intensity.  Never had it before.  No dysuria or hx of stones.  HPI     Past Medical History:  Diagnosis Date   CAD (coronary artery disease) 09/2013   Heart disease    HTN (hypertension)    Migraines    Trigeminal neuralgia of right side of face     Patient Active Problem List   Diagnosis Date Noted   Angina pectoris (HCC)    Abnormal nuclear stress test     Past Surgical History:  Procedure Laterality Date   CORONARY STENT INTERVENTION N/A 09/14/2019   Procedure: CORONARY STENT INTERVENTION;  Surgeon: Lennette Bihari, MD;  Location: [facility] INVASIVE CV LAB;  Service: Cardiovascular;  Laterality: N/A;   CRANIOTOMY  04/26/2020   MVD   LEFT HEART CATH AND CORONARY ANGIOGRAPHY N/A 09/14/2019   Procedure: LEFT HEART CATH AND CORONARY ANGIOGRAPHY;  Surgeon: Lennette Bihari, MD;  Location: [facility] INVASIVE CV LAB;  Service: Cardiovascular;  Laterality: N/A;       Family History  Problem Relation Age of Onset   Diabetes Mother        type 2   Hypertension Mother    Cancer - Colon Father    Diabetes Mellitus II Father    Hypertension Father    CAD Brother        bypass x4   Heart failure Maternal Grandmother     Social History   Tobacco Use   Smoking status: Never   Smokeless tobacco: Never  Vaping Use   Vaping Use: Never used  Substance Use Topics   Alcohol use: Never   Drug use: Never    Home Medications Prior to Admission medications   Medication Sig Start Date End Date Taking? Authorizing Provider  oxyCODONE (ROXICODONE) 5 MG immediate  release tablet Take 1 tablet (5 mg total) by mouth every 6 (six) hours as needed for up to 20 doses for severe pain. 01/08/21  Yes Terald Sleeper, MD  aspirin EC 81 MG tablet Take 81 mg by mouth daily.    [provider]  baclofen (LIORESAL) 10 MG tablet Take 2 tablets (20 mg total) by mouth 2 (two) times daily. Patient taking differently: Take 10 mg by mouth daily. 02/23/20   Levert Feinstein, MD  isosorbide mononitrate (IMDUR) 30 MG 24 hr tablet Take 1 tablet (30 mg total) by mouth daily. 10/23/20 01/21/21  Croitoru, Mihai, MD  metoprolol succinate (TOPROL-XL) 25 MG 24 hr tablet Take 1 tablet (25 mg total) by mouth daily. Take with or immediately following a meal. Patient taking differently: Take 25 mg by mouth at bedtime. 09/06/19 06/21/20  Croitoru, Mihai, MD  nitroGLYCERIN (NITROSTAT) 0.4 MG SL tablet Place 1 tablet (0.4 mg total) under the tongue every 5 (five) minutes as needed for chest pain. 10/23/20 01/21/21  Croitoru, Mihai, MD  rosuvastatin (CRESTOR) 20 MG tablet Take 1 tablet (20 mg total) by mouth daily. 06/21/20 09/19/20  Croitoru, Rachelle Hora, MD    Allergies    Patient has no known  allergies.  Review of Systems   Review of Systems  Constitutional:  Negative for chills and fever.  Cardiovascular:  Negative for chest pain and palpitations.  Gastrointestinal:  Negative for abdominal pain and vomiting.  Genitourinary:  Positive for flank pain. Negative for dysuria and hematuria.  Musculoskeletal:  Negative for arthralgias and back pain.  Skin:  Negative for color change and rash.  All other systems reviewed and are negative.  Physical Exam Updated Vital Signs BP (!) 149/92   Pulse 99   Temp 98.5 F (36.9 C) (Oral)   Resp 18   SpO2 97%   Physical Exam Constitutional:      General: He is not in acute distress. HENT:     Head: Normocephalic and atraumatic.  Eyes:     Conjunctiva/sclera: Conjunctivae normal.     Pupils: Pupils are equal, round, and reactive to light.   Cardiovascular:     Rate and Rhythm: Normal rate and regular rhythm.  Pulmonary:     Effort: Pulmonary effort is normal. No respiratory distress.  Abdominal:     General: There is no distension.     Tenderness: There is no abdominal tenderness. There is no right CVA tenderness or left CVA tenderness.  Skin:    General: Skin is warm and dry.  Neurological:     General: No focal deficit present.     Mental Status: He is alert. Mental status is at baseline.  Psychiatric:        Mood and Affect: Mood normal.        Behavior: Behavior normal.    ED Results / Procedures / Treatments   Labs (all labs ordered are listed, but only abnormal results are displayed) Labs Reviewed  BASIC METABOLIC PANEL - Abnormal; Notable for the following components:      Result Value   Glucose, Bld 125 (*)    BUN 24 (*)    Creatinine, Ser 1.85 (*)    GFR, Estimated 42 (*)    All other components within normal limits  URINALYSIS, ROUTINE W REFLEX MICROSCOPIC  CBC WITH DIFFERENTIAL/PLATELET    EKG None  Radiology CT Renal Stone Study  Result Date: 01/08/2021 CLINICAL DATA:  Three days of left-sided flank pain EXAM: CT ABDOMEN AND PELVIS WITHOUT CONTRAST TECHNIQUE: Multidetector CT imaging of the abdomen and pelvis was performed following the standard protocol without IV contrast. COMPARISON:  None. FINDINGS: Lower chest: Scattered bibasilar peripheral predominant ground-glass opacities. Hepatobiliary: Unremarkable noncontrast appearance of the hepatic parenchyma. Gallbladder is unremarkable. No biliary ductal dilation. Pancreas: Within normal limits. Spleen: Borderline splenic enlargement measuring 13 mm. Adrenals/Urinary Tract: Bilateral adrenal glands are unremarkable. No hydronephrosis. Partially exophytic right lower pole 2.7 cm hypodense lesion incompletely evaluated without IV contrast most consistent with a renal cyst. No hydronephrosis. Tiny nonobstructive left lower pole renal stone. Prominence of  the left ureter with periureteric stranding and a punctate calcification at the left UVJ. Urinary bladder is grossly unremarkable for degree of distension. Stomach/Bowel: Stomach is within normal limits. Appendix appears normal. No evidence of bowel wall thickening, distention, or inflammatory changes. Sigmoid diverticulosis without findings of acute diverticulitis. Vascular/Lymphatic: Aortic atherosclerosis without aneurysmal dilation. No pathologically enlarged abdominal or pelvic lymph nodes. Reproductive: Dystrophic prostatic calcification. Other: No abdominopelvic ascites. Small fat containing right inguinal hernia. Musculoskeletal: Mild multilevel degenerative changes spine. Degenerative changes bilateral hips. No acute osseous abnormality. IMPRESSION: 1. Prominence of the left ureter with periureteric stranding and a punctate (approximately 1 mm) calcification at the left UVJ without  associated hydronephrosis. 2. Tiny nonobstructive left lower pole renal stone. 3. Sigmoid diverticulosis without findings of acute diverticulitis. 4. Scattered bibasilar peripheral predominant ground-glass opacities, likely infectious or inflammatory. 5.  Aortic Atherosclerosis (ICD10-I70.0). Electronically Signed   By: Maudry Mayhew MD   On: 01/08/2021 14:59    Procedures Procedures   Medications Ordered in ED Medications  ibuprofen (ADVIL) tablet 800 mg (800 mg Oral Given 01/08/21 1353)    ED Course  I have reviewed the triage vital signs and the nursing notes.  Pertinent labs & imaging results that were available during my care of the patient were reviewed by me and considered in my medical decision making (see chart for details).  Ddx includes kidney stone vs UTI vs colitis vs other Labs reviewed - no significant findings.  WBC normal UA without sign of infection  CT abd showing small 1 mm stone at left UVC - this is likely the cause of his symptoms  Motrin given here.  Pain is overall controlled enough he  wants to manage this at home.  Discussed pain mgmt at home.  He has flomax already from his PCP.   Okay for discharge     Final Clinical Impression(s) / ED Diagnoses Final diagnoses:  Ureteral colic  Kidney stone    Rx / DC Orders ED Discharge Orders          Ordered    oxyCODONE (ROXICODONE) 5 MG immediate release tablet  Every 6 hours PRN        01/08/21 1556             Kache Mcclurg, Kermit Balo, MD 01/08/21 [phone]

## 2021-01-23 ENCOUNTER — Ambulatory Visit: Payer: BC Managed Care – PPO | Admitting: Cardiovascular Disease

## 2021-06-20 ENCOUNTER — Other Ambulatory Visit: Payer: Self-pay | Admitting: Cardiovascular Disease

## 2023-06-05 IMAGING — CT CT RENAL STONE PROTOCOL
2 of 4 series · 15 of 46 positions shown, 17 images · non-contrast
Comparison: None.

CLINICAL DATA: Three days of left-sided flank pain

EXAM:
CT ABDOMEN AND PELVIS WITHOUT CONTRAST
TECHNIQUE: Multidetector CT imaging of the abdomen and pelvis was performed
following the standard protocol without IV contrast.

[Series 2: axial st · axial · 0.84mm/px · z∈[+948,+1418]mm · 12 of 106 slices shown, 14 images]
[im 6/106  soft-tissue]
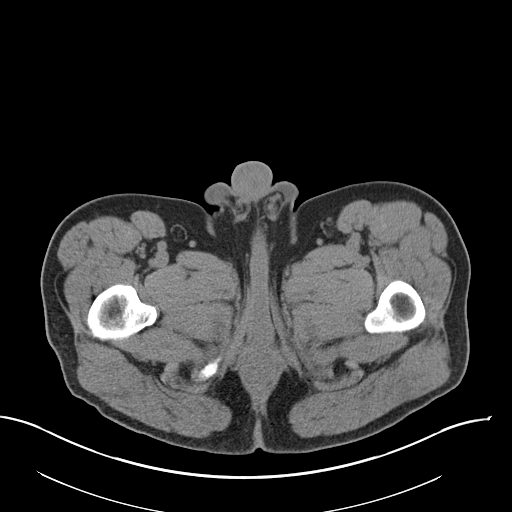
[im 6/106  bone]
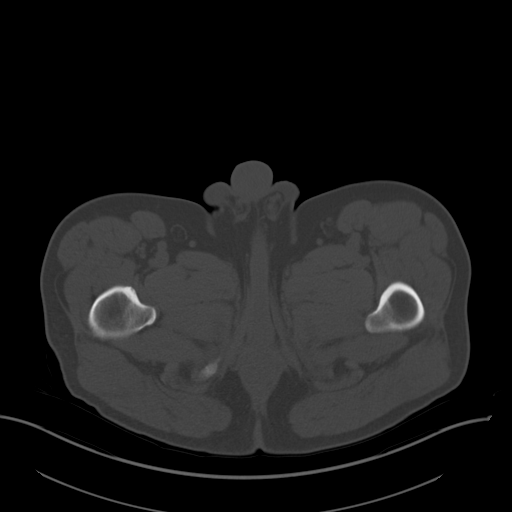
[im 17/106  soft-tissue]
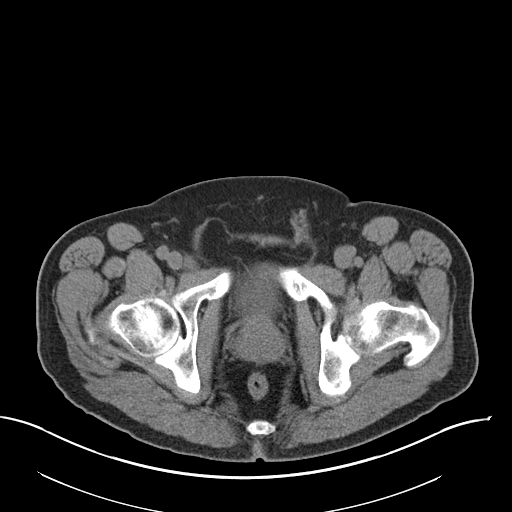
[im 23/106  soft-tissue]
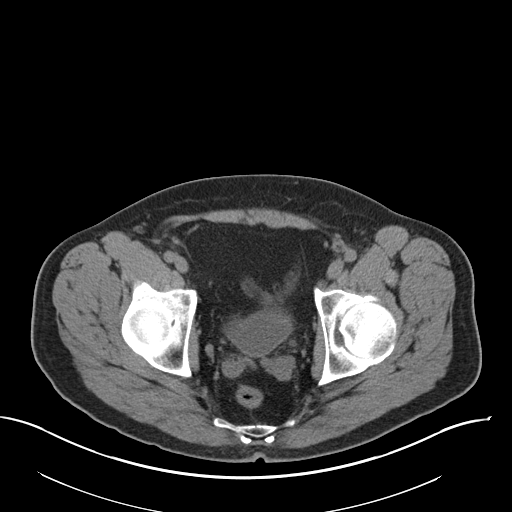
[im 34/106  soft-tissue]
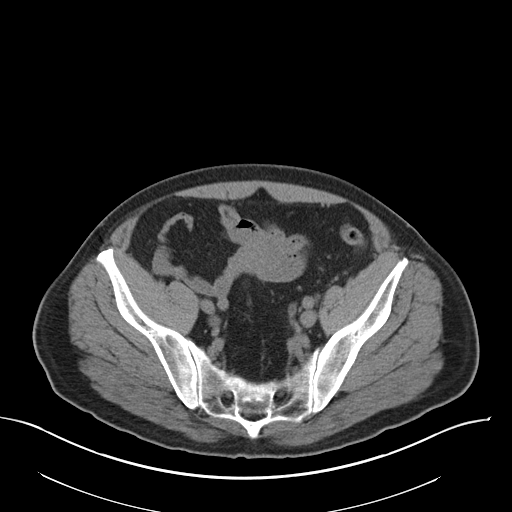
[im 39/106  soft-tissue]
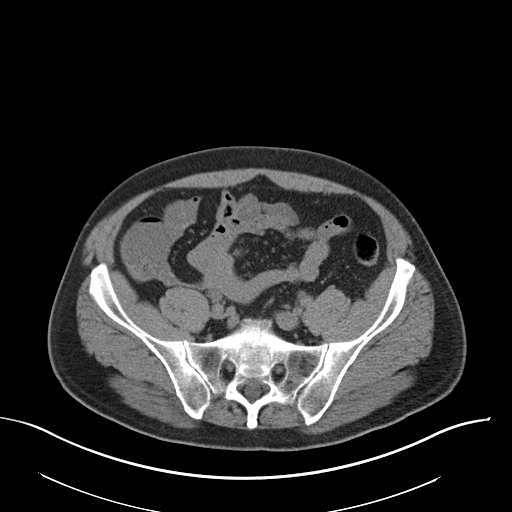
[im 50/106  soft-tissue]
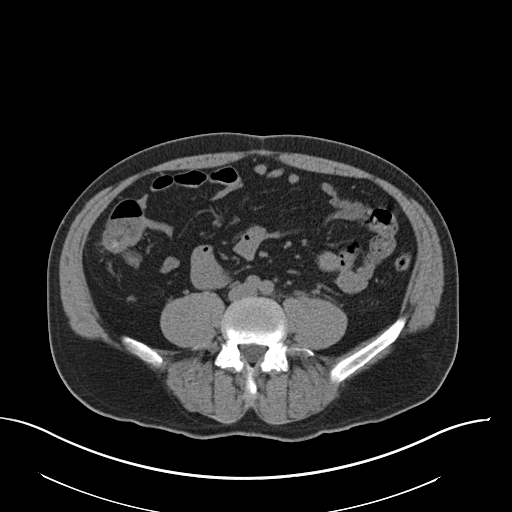
[im 56/106  soft-tissue]
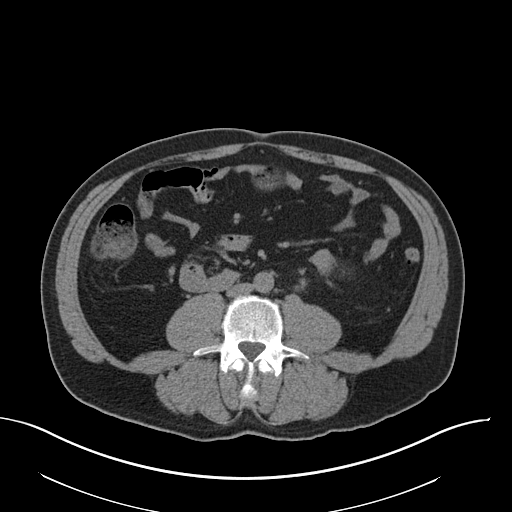
[im 67/106  soft-tissue]
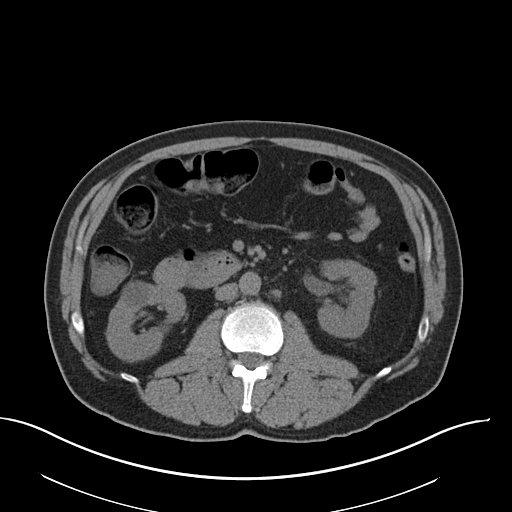
[im 72/106  soft-tissue]
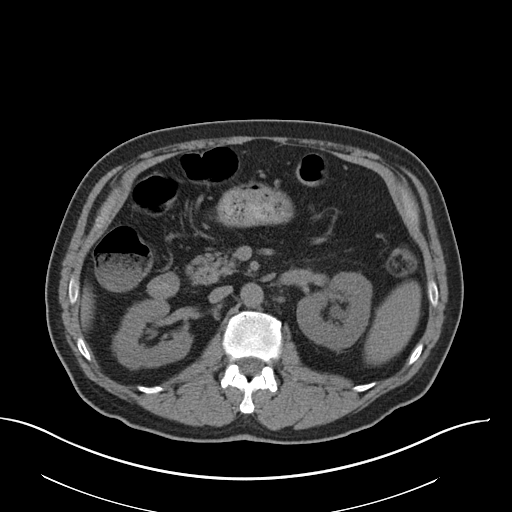
[im 72/106  bone]
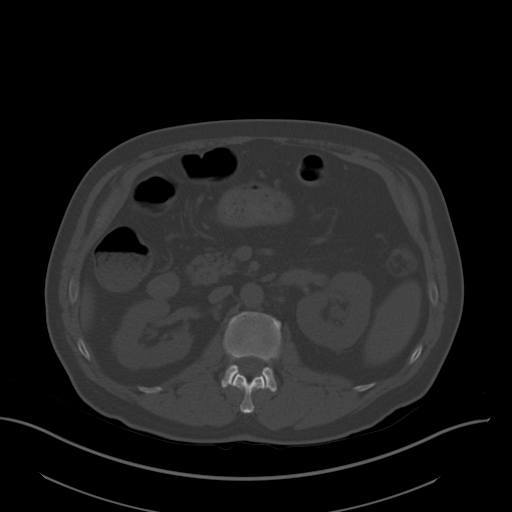
[im 83/106  soft-tissue]
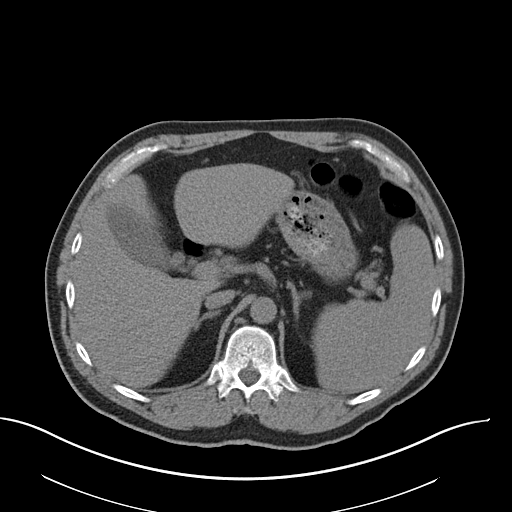
[im 89/106  soft-tissue]
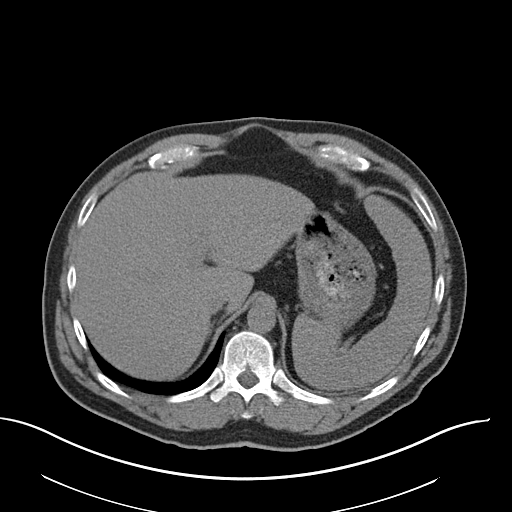
[im 100/106  soft-tissue]
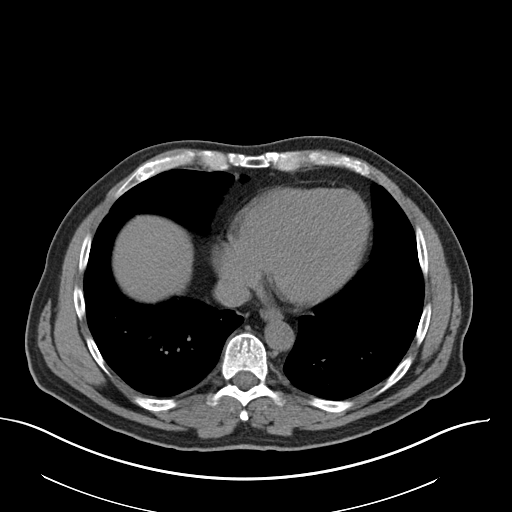

[Series 4: coronal · coronal · 0.76mm/px · 3 of 151 slices shown]
[im 51/151  soft-tissue]
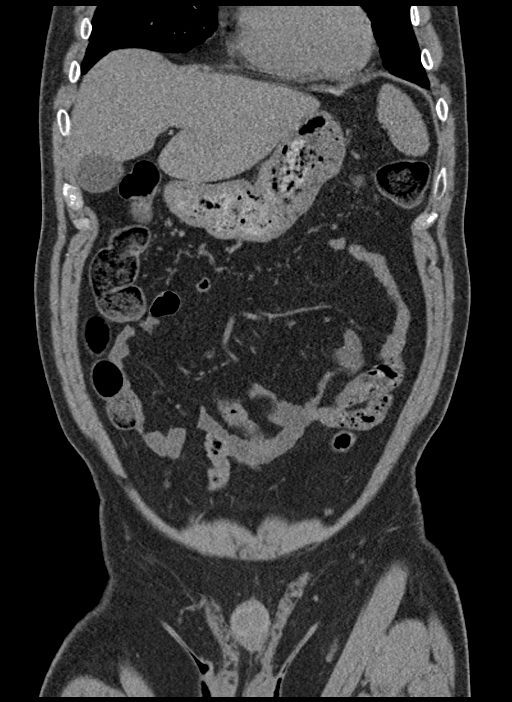
[im 67/151  soft-tissue]
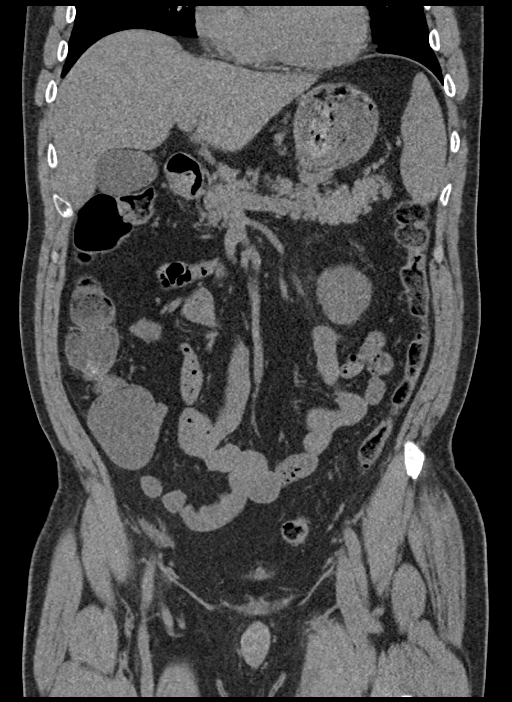
[im 84/151  soft-tissue]
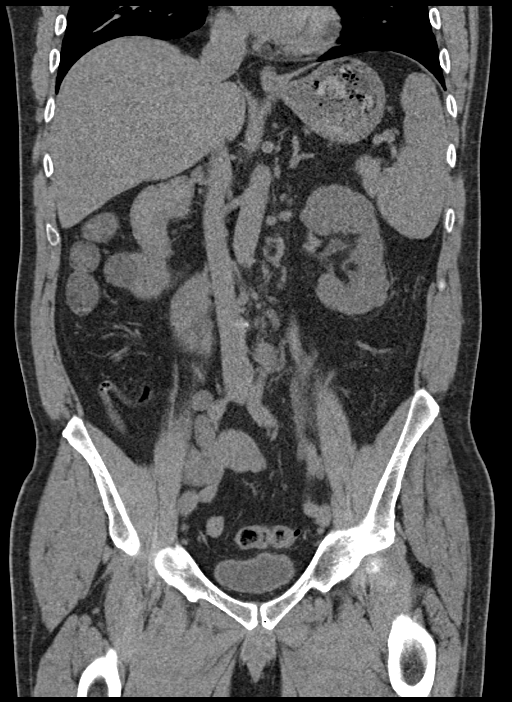

[15 of 46 positions shown; findings below may reference images not displayed]

FINDINGS: Lower chest: Scattered bibasilar peripheral predominant ground-glass
opacities.

Hepatobiliary: Unremarkable noncontrast appearance of the hepatic
parenchyma. Gallbladder is unremarkable. No biliary ductal dilation.

Pancreas: Within normal limits.

Spleen: Borderline splenic enlargement measuring 13 mm.

Adrenals/Urinary Tract: Bilateral adrenal glands are unremarkable.

No hydronephrosis. Partially exophytic right lower pole 2.7 cm
hypodense lesion incompletely evaluated without IV contrast most
consistent with a renal cyst. No hydronephrosis. Tiny nonobstructive
left lower pole renal stone. Prominence of the left ureter with
periureteric stranding and a punctate calcification at the left UVJ.

Urinary bladder is grossly unremarkable for degree of distension.

Stomach/Bowel: Stomach is within normal limits. Appendix appears
normal. No evidence of bowel wall thickening, distention, or
inflammatory changes. Sigmoid diverticulosis without findings of
acute diverticulitis.

Vascular/Lymphatic: Aortic atherosclerosis without aneurysmal
dilation. No pathologically enlarged abdominal or pelvic lymph
nodes.

Reproductive: Dystrophic prostatic calcification.

Other: No abdominopelvic ascites. Small fat containing right
inguinal hernia.

Musculoskeletal: Mild multilevel degenerative changes spine.
Degenerative changes bilateral hips. No acute osseous abnormality.
IMPRESSION: 1. Prominence of the left ureter with periureteric stranding and a
punctate (approximately 1 mm) calcification at the left UVJ without
associated hydronephrosis.
2. Tiny nonobstructive left lower pole renal stone.
3. Sigmoid diverticulosis without findings of acute diverticulitis.
4. Scattered bibasilar peripheral predominant ground-glass
opacities, likely infectious or inflammatory.
5.  Aortic Atherosclerosis ([id number]).

## 2024-04-16 ENCOUNTER — Encounter: Payer: Self-pay | Admitting: Cardiovascular Disease

## 2024-04-16 ENCOUNTER — Ambulatory Visit: Payer: Self-pay | Attending: Cardiovascular Disease | Admitting: Cardiovascular Disease

## 2024-04-16 VITALS — BP 116/68 | HR 57 | Ht 72.0 in | Wt 219.0 lb

## 2024-04-16 DIAGNOSIS — I10 Essential (primary) hypertension: Secondary | ICD-10-CM | POA: Diagnosis not present

## 2024-04-16 DIAGNOSIS — R0602 Shortness of breath: Secondary | ICD-10-CM

## 2024-04-16 DIAGNOSIS — R7303 Prediabetes: Secondary | ICD-10-CM

## 2024-04-16 DIAGNOSIS — E785 Hyperlipidemia, unspecified: Secondary | ICD-10-CM

## 2024-04-16 DIAGNOSIS — I2510 Atherosclerotic heart disease of native coronary artery without angina pectoris: Secondary | ICD-10-CM

## 2024-04-16 DIAGNOSIS — Z8739 Personal history of other diseases of the musculoskeletal system and connective tissue: Secondary | ICD-10-CM

## 2024-04-16 NOTE — Patient Instructions (Addendum)
   Testing/Procedures:  Your physician has requested that you have an echocardiogram. Echocardiography is a painless test that uses sound waves to create images of your heart. It provides your doctor with information about the size and shape of your heart and how well your heart's chambers and valves are working. This procedure takes approximately one hour. There are no restrictions for this procedure. Please do NOT wear cologne, perfume, aftershave, or lotions (deodorant is allowed). Please arrive 15 minutes prior to your appointment time.  Please note: We ask at that you not bring children with you during ultrasound (echo/ vascular) testing. Due to room size and safety concerns, children are not allowed in the ultrasound rooms during exams. Our front office staff cannot provide observation of children in our lobby area while testing is being conducted. An adult accompanying a patient to their appointment will only be allowed in the ultrasound room at the discretion of the ultrasound technician under special circumstances. We apologize for any inconvenience. [address]  Follow-Up: At [facility], you and your health needs are our priority.  As part of our continuing mission to provide you with exceptional heart care, our providers are all part of one team.  This team includes your primary Cardiologist (physician) and Advanced Practice Providers or APPs (Physician Assistants and Nurse Practitioners) who all work together to provide you with the care you need, when you need it.  Your next appointment:   12 month(s)  Provider:   Jerel Balding, MD

## 2024-04-16 NOTE — Progress Notes (Signed)
 Cardiology Office Note:    Date:  04/16/2024   ID:  Kevin Browning, DOB Dec 13, 1965, MRN [id number]  PCP:  Pcp, No  Cardiologist:  Jerel Balding, MD  Electrophysiologist:  None   Referring MD: No ref. provider found   Chief Complaint  Patient presents with   Shortness of Breath  CAD  History of Present Illness:    Kevin Browning is a 58 y.o. male with a hx of dyslipidemia and coronary artery disease, initially presenting with exertional angina in March 2015 ( 95% LAD stenosis, treated with placement of a drug-eluting Promus Premier [id number] stent in the proximal LAD artery in [place] ), returning with exertional angina in February 2021.  Nuclear stress testing showed 2 areas of ischemia in the inferolateral and anterolateral walls, corresponding to the left circumflex and diagonal arteries respectively.  Cardiac catheterization on 09/14/2019 showed total occlusion of a large diagonal artery filling via collaterals and a 95% stenosis in the proximal left circumflex coronary artery, treated with a 2.5 x 8 mm Resolute DES.  He underwent decompression surgery for trigeminal neuralgia in 2022 and had to be reoperated due to infection.  Since the last stent procedure he has generally done quite well.  He has not taken long-acting nitrates at all and has not used sublingual nitroglycerin .  He is only on a low-dose of metoprolol .  Takes low-dose aspirin  and he has been compliant with his statin and has an excellent LDL cholesterol, but continues to have very low HDL, elevated triglycerides and A1c in the prediabetes range.  He does have exertional dyspnea.  His wife has pointed out that he puffs and puffs during a recent bathroom renovation.  He did not have any chest pain going up and down the stairs several times a day during that renovation.  He has gained some weight (20 pounds in the last 4 years) and now is borderline obese with a BMI of 29.7.  He does have occasional dizziness with changes in position  and tends to run a relatively low blood pressure  He has not had problems of lower extremity edema, orthopnea, PND, palpitations, syncope, focal neurological complaints  He did not tolerate atorvastatin , but seems to be okay on rosuvastatin .      His angina was represented by discomfort in his left jaw and left arm as well as the retrosternal area: He motions to the center of his chest with a clawlike movement and describes the sensation as an elephant standing on his chest.  The nuclear stress test also showed ischemia in the diagonal territory and an ejection fraction of 46%.  At the time of his cardiac catheterization the diagonal artery was found to be totally occluded with good collaterals.  Left ventricular systolic function is normal with an ejection fraction of 55%, but the LVEDP was slightly elevated at 20-23.  His wife Ismael is a Scientist, research (physical sciences).  They are originally from [place].  Their oldest son Max is currently in internal medicine resident at [facility] clinic in [place].  His daughter is an ED nurse at [employer].  His younger brother had multivessel coronary bypass surgery about the same time that Kevin Browning received this stent.  Past Medical History:  Diagnosis Date   CAD (coronary artery disease) 09/2013   Heart disease    HTN (hypertension)    Migraines    Trigeminal neuralgia of right side of face     Past Surgical History:  Procedure Laterality Date   CORONARY STENT INTERVENTION N/A 09/14/2019  Procedure: CORONARY STENT INTERVENTION;  Surgeon: Burnard Debby LABOR, MD;  Location: [facility] INVASIVE CV LAB;  Service: Cardiovascular;  Laterality: N/A;   CRANIOTOMY  04/26/2020   MVD   LEFT HEART CATH AND CORONARY ANGIOGRAPHY N/A 09/14/2019   Procedure: LEFT HEART CATH AND CORONARY ANGIOGRAPHY;  Surgeon: Burnard Debby LABOR, MD;  Location: [facility] INVASIVE CV LAB;  Service: Cardiovascular;  Laterality: N/A;    Current Medications: Current Meds  Medication Sig   allopurinol  (ZYLOPRIM) 300 MG tablet Take 300 mg by mouth daily.   aspirin  EC 81 MG tablet Take 81 mg by mouth daily.   cholecalciferol (VITAMIN D3) 25 MCG (1000 UNIT) tablet Take 1,000 Units by mouth daily.   lisinopril (ZESTRIL) 2.5 MG tablet Take 2.5 mg by mouth daily.   MAGNESIUM PO Take 200 mg by mouth daily at 6 (six) AM.   metoprolol  succinate (TOPROL -XL) 25 MG 24 hr tablet Take 1 tablet (25 mg total) by mouth daily. Take with or immediately following a meal.   rosuvastatin  (CRESTOR ) 20 MG tablet Take 1 tablet (20 mg total) by mouth daily. Patient need a appointment for future refills.     Allergies:   Patient has no known allergies.   Social History   Socioeconomic History   Marital status: Married    Spouse name: Ismael   Number of children: 3   Years of education: 12   Highest education level: Not on file  Occupational History    Comment: NA  Tobacco Use   Smoking status: Never   Smokeless tobacco: Never  Vaping Use   Vaping status: Never Used  Substance and Sexual Activity   Alcohol use: Never   Drug use: Never   Sexual activity: Not on file  Other Topics Concern   Not on file  Social History Narrative   Lives with family   Caffeine- some   Social Drivers of Corporate investment banker Strain: Not on file  Food Insecurity: Not on file  Transportation Needs: Not on file  Physical Activity: Not on file  Stress: Not on file  Social Connections: Not on file     Family History: The patient's family history includes CAD in his brother; Cancer - Colon in his father; Diabetes in his mother; Diabetes Mellitus II in his father; Heart failure in his maternal grandmother; Hypertension in his father and mother.  ROS:   Please see the history of present illness.    All other systems are reviewed and are negative.   EKGs/Labs/Other Studies Reviewed:    The following studies were reviewed today: Angiogram/PCI 09/14/2019  EKG:    EKG Interpretation Date/Time:  Friday April 16 2024 11:24:11 EDT Ventricular Rate:  57 PR Interval:  176 QRS Duration:  76 QT Interval:  432 QTC Calculation: 420 R Axis:   -18  Text Interpretation: Sinus bradycardia When compared with ECG of 14-Sep-2019 08:51, No significant change was found Confirmed by Jaedon Siler ([id number]) on 04/16/2024 11:29:57 AM         Recent Labs: No results found for requested labs within last 365 days.   02/02/2024 Cholesterol 107, triglycerides 280, LDL 42, HDL 22 Hemoglobin J8r 6.0%, fasting glucose 99, potassium 4.8, TSH 2.310, hemoglobin 15.2, creatinine 1.04, normal liver function tests  Recent Lipid Panel    Component Value Date/Time   CHOL 159 09/01/2019 0830   TRIG 247 (H) 09/01/2019 0830   HDL 23 (L) 09/01/2019 0830   CHOLHDL 6.9 (H) 09/01/2019 0830   LDLCALC  94 09/01/2019 0830    Physical Exam:    VS:  BP 116/68 (BP Location: Left Arm, Patient Position: Sitting, Cuff Size: Large)   Pulse (!) 57   Ht 6' (1.829 m)   Wt 219 lb (99.3 kg)   SpO2 98%   BMI 29.70 kg/m     Wt Readings from Last 3 Encounters:  04/16/24 219 lb (99.3 kg)  10/23/20 204 lb (92.5 kg)  06/26/20 200 lb (90.7 kg)     General: Alert, oriented x3, no distress, borderline obese Head: no evidence of trauma, PERRL, EOMI, no exophtalmos or lid lag, no myxedema, no xanthelasma; normal ears, nose and oropharynx Neck: normal jugular venous pulsations and no hepatojugular reflux; brisk carotid pulses without delay and no carotid bruits Chest: clear to auscultation, no signs of consolidation by percussion or palpation, normal fremitus, symmetrical and full respiratory excursions Cardiovascular: normal position and quality of the apical impulse, regular rhythm, normal first and second heart sounds, no murmurs, rubs or gallops Abdomen: no tenderness or distention, no masses by palpation, no abnormal pulsatility or arterial bruits, normal bowel sounds, no hepatosplenomegaly Extremities: no clubbing, cyanosis or  edema; 2+ radial, ulnar and brachial pulses bilaterally; 2+ right femoral, posterior tibial and dorsalis pedis pulses; 2+ left femoral, posterior tibial and dorsalis pedis pulses; no subclavian or femoral bruits Neurological: grossly nonfocal Psych: Normal mood and affect    ASSESSMENT:    1. Coronary artery disease involving native coronary artery of native heart without angina pectoris   2. Essential hypertension   3. Dyslipidemia (high LDL; low HDL)   4. Prediabetes   5. History of gout   6. Shortness of breath    PLAN:    In order of problems listed above:  CAD: CCS functional class II exertional angina.  This could be due to the chronically occluded large diagonal artery.  Add long-acting nitrates to his beta-blocker.  Continue aspirin  and high-dose statin.  Also gave him a prescription for short acting nitroglycerin  and instructed him in its safe and proper use.  For severe chest discomfort lasting than 3 minutes not relieved by 3 sublingual nitroglycerin  tablets taken consecutively every 5 minutes, he should seek emergency medical attention.   HTN: Very well-controlled on low-dose lisinopril and low-dose metoprolol  succinate.  Occasional orthostatic hypotension symptoms.  Asked him to cut the dose of metoprolol  succinate in half to see if this helps with his dizziness. HLP: Excellent LDL cholesterol, but very low HDL and elevated triglycerides consistent with insulin resistance/metabolic syndrome.  Encourage weight loss, regular exercise. Prediabetes: Predictable occurrence with his weight gain on a background of what appears to be genetically inherited insulin resistance.  At high risk of developing diabetes and worsened vascular complications with this weight.  I think he would benefit from weight loss medication such as Wegovy or Zepbound, if covered by his insurance.  Will ask our Pharm.D. team to see if either 1 of these medications is a reasonable option. History of gout: Knows he  needs to avoid purine rich foods.  Avoid thiazide diuretics and avoid dehydration. Exertional dyspnea: I wonder if this is due to weight gain.  He has exertional shortness of breath, but not exertional chest discomfort.  However, cannot exclude heart disease as the cause, will check an echocardiogram.  Medication Adjustments/Labs and Tests Ordered: Current medicines are reviewed at length with the patient today.  Concerns regarding medicines are outlined above.  Orders Placed This Encounter  Procedures   EKG 12-Lead  ECHOCARDIOGRAM COMPLETE  Reduce metoprolol  succinate to 12.5 mg once daily (half of the 25 mg daily) No orders of the defined types were placed in this encounter.   Patient Instructions    Testing/Procedures:  Your physician has requested that you have an echocardiogram. Echocardiography is a painless test that uses sound waves to create images of your heart. It provides your doctor with information about the size and shape of your heart and how well your heart's chambers and valves are working. This procedure takes approximately one hour. There are no restrictions for this procedure. Please do NOT wear cologne, perfume, aftershave, or lotions (deodorant is allowed). Please arrive 15 minutes prior to your appointment time.  Please note: We ask at that you not bring children with you during ultrasound (echo/ vascular) testing. Due to room size and safety concerns, children are not allowed in the ultrasound rooms during exams. Our front office staff cannot provide observation of children in our lobby area while testing is being conducted. An adult accompanying a patient to their appointment will only be allowed in the ultrasound room at the discretion of the ultrasound technician under special circumstances. We apologize for any inconvenience. [address]  Follow-Up: At [facility], you and your health needs are our priority.  As part of our continuing mission to  provide you with exceptional heart care, our providers are all part of one team.  This team includes your primary Cardiologist (physician) and Advanced Practice Providers or APPs (Physician Assistants and Nurse Practitioners) who all work together to provide you with the care you need, when you need it.  Your next appointment:   12 month(s)  Provider:   Jerel Balding, MD                 Signed, Jerel Balding, MD  04/16/2024 1:18 PM    [facility]

## 2024-05-26 ENCOUNTER — Ambulatory Visit (HOSPITAL_COMMUNITY)
Admission: RE | Admit: 2024-05-26 | Discharge: 2024-05-26 | Disposition: A | Discharge status: Home / Self Care | Source: Ambulatory Visit | Attending: Internal Medicine | Admitting: Internal Medicine

## 2024-05-26 DIAGNOSIS — R0602 Shortness of breath: Secondary | ICD-10-CM | POA: Diagnosis present

## 2024-05-26 LAB — ECHOCARDIOGRAM COMPLETE
Area-P 1/2: 4.64 cm2
S' Lateral: 3.1 cm

## 2024-05-27 ENCOUNTER — Ambulatory Visit: Payer: Self-pay | Admitting: Cardiovascular Disease
